# Patient Record
Sex: Male | Born: 1955 | Race: White | Hispanic: No | Marital: Married | Smoking: Light tobacco smoker
Health system: Southern US, Community
[De-identification: ages and names within clinical notes are randomized; demographics above are authoritative.]

## PROBLEM LIST (undated history)

## (undated) DIAGNOSIS — M1990 Unspecified osteoarthritis, unspecified site: Secondary | ICD-10-CM

## (undated) HISTORY — PX: OTHER SURGICAL HISTORY: SHX169

## (undated) HISTORY — PX: TOTAL HIP ARTHROPLASTY: SHX124

## (undated) HISTORY — PX: KNEE ARTHROSCOPY: SUR90

---

## 2002-05-01 ENCOUNTER — Encounter: Payer: Self-pay | Admitting: Family Medicine

## 2002-05-01 ENCOUNTER — Encounter: Admission: RE | Admit: 2002-05-01 | Discharge: 2002-05-01 | Payer: Self-pay | Admitting: Family Medicine

## 2006-04-07 ENCOUNTER — Ambulatory Visit: Payer: Self-pay | Admitting: Gastroenterology

## 2011-10-07 ENCOUNTER — Other Ambulatory Visit (HOSPITAL_COMMUNITY): Payer: Self-pay | Admitting: Neurological Surgery

## 2011-10-07 DIAGNOSIS — M545 Low back pain, unspecified: Secondary | ICD-10-CM

## 2011-10-07 DIAGNOSIS — M47816 Spondylosis without myelopathy or radiculopathy, lumbar region: Secondary | ICD-10-CM

## 2011-10-12 ENCOUNTER — Other Ambulatory Visit (HOSPITAL_COMMUNITY): Payer: Self-pay

## 2011-10-13 ENCOUNTER — Ambulatory Visit (HOSPITAL_COMMUNITY)
Admission: RE | Admit: 2011-10-13 | Discharge: 2011-10-13 | Disposition: A | Discharge status: Home / Self Care | Payer: Commercial Managed Care - PPO | Source: Ambulatory Visit | Attending: Neurological Surgery | Admitting: Neurological Surgery

## 2011-10-13 DIAGNOSIS — M25559 Pain in unspecified hip: Secondary | ICD-10-CM | POA: Insufficient documentation

## 2011-10-13 DIAGNOSIS — M545 Low back pain, unspecified: Secondary | ICD-10-CM

## 2011-10-13 DIAGNOSIS — M47816 Spondylosis without myelopathy or radiculopathy, lumbar region: Secondary | ICD-10-CM

## 2011-10-13 DIAGNOSIS — M48061 Spinal stenosis, lumbar region without neurogenic claudication: Secondary | ICD-10-CM | POA: Insufficient documentation

## 2011-10-13 DIAGNOSIS — M5126 Other intervertebral disc displacement, lumbar region: Secondary | ICD-10-CM | POA: Insufficient documentation

## 2011-12-20 ENCOUNTER — Other Ambulatory Visit: Payer: Self-pay | Admitting: Neurological Surgery

## 2012-01-16 ENCOUNTER — Encounter (HOSPITAL_COMMUNITY): Payer: Self-pay | Admitting: Pharmacy Technician

## 2012-01-17 ENCOUNTER — Inpatient Hospital Stay (HOSPITAL_COMMUNITY)
Admission: RE | Admit: 2012-01-17 | Discharge: 2012-01-17 | Payer: Commercial Managed Care - PPO | Source: Ambulatory Visit

## 2012-01-17 ENCOUNTER — Encounter (HOSPITAL_COMMUNITY): Payer: Self-pay

## 2012-01-17 NOTE — Pre-Procedure Instructions (Addendum)
18 Victor Snyder  01/17/2012   Your procedure is scheduled on:  01/27/12  Report to [facility] at 1015 AM.  Call this number if you have problems the morning of surgery: [phone]   Remember:   Do not eat food:After Midnight.  .  Take these medicines the morning of surgery with A SIP OF WATER: none stop meloxicam   Do not wear jewelry, make-up or nail polish.  Do not wear lotions, powders, or perfumes. You may wear deodorant.  Do not shave 48 hours prior to surgery. Men may shave face and neck.  Do not bring valuables to the hospital.  Contacts, dentures or bridgework may not be worn into surgery.  Leave suitcase in the car. After surgery it may be brought to your room.  For patients admitted to the hospital, checkout time is 11:00 AM the day of discharge.   Patients discharged the day of surgery will not be allowed to drive home.  Name and phone number of your driver:suzan [phone]  wife   Special Instructions: CHG Shower Use Special Wash: 1/2 bottle night before surgery and 1/2 bottle morning of surgery.   Please read over the following fact sheets that you were given: Pain Booklet, Coughing and Deep Breathing, MRSA Information and Surgical Site Infection Prevention

## 2012-01-18 ENCOUNTER — Encounter (HOSPITAL_COMMUNITY)
Admission: RE | Admit: 2012-01-18 | Discharge: 2012-01-18 | Disposition: A | Discharge status: Home / Self Care | Payer: Commercial Managed Care - PPO | Source: Ambulatory Visit | Attending: Neurological Surgery | Admitting: Neurological Surgery

## 2012-01-18 LAB — CBC
HCT: 39.1 % (ref 39.0–52.0)
Hemoglobin: 14.2 g/dL (ref 13.0–17.0)
MCH: 30.8 pg (ref 26.0–34.0)
MCHC: 36.3 g/dL — ABNORMAL HIGH (ref 30.0–36.0)
MCV: 84.8 fL (ref 78.0–100.0)
RDW: 13.7 % (ref 11.5–15.5)

## 2012-01-26 MED ORDER — DEXTROSE 5 % IV SOLN
2.0000 g | INTRAVENOUS | Status: AC
  Administered 2012-01-27: 2 g via INTRAVENOUS
  Filled 2012-01-26: qty 20

## 2012-01-27 ENCOUNTER — Ambulatory Visit (HOSPITAL_COMMUNITY): Payer: Commercial Managed Care - PPO

## 2012-01-27 ENCOUNTER — Encounter (HOSPITAL_COMMUNITY): Payer: Self-pay | Admitting: Critical Care Medicine

## 2012-01-27 ENCOUNTER — Encounter (HOSPITAL_COMMUNITY)
Admission: RE | Disposition: A | Discharge status: Home / Self Care | Payer: Self-pay | Source: Ambulatory Visit | Attending: Neurological Surgery

## 2012-01-27 ENCOUNTER — Ambulatory Visit (HOSPITAL_COMMUNITY): Payer: Commercial Managed Care - PPO | Admitting: Critical Care Medicine

## 2012-01-27 ENCOUNTER — Encounter (HOSPITAL_COMMUNITY): Payer: Self-pay | Admitting: *Deleted

## 2012-01-27 ENCOUNTER — Ambulatory Visit (HOSPITAL_COMMUNITY)
Admission: RE | Admit: 2012-01-27 | Discharge: 2012-01-27 | Disposition: A | Discharge status: Home / Self Care | Payer: Commercial Managed Care - PPO | Source: Ambulatory Visit | Attending: Neurological Surgery | Admitting: Neurological Surgery

## 2012-01-27 DIAGNOSIS — M5126 Other intervertebral disc displacement, lumbar region: Secondary | ICD-10-CM | POA: Insufficient documentation

## 2012-01-27 DIAGNOSIS — F17200 Nicotine dependence, unspecified, uncomplicated: Secondary | ICD-10-CM | POA: Insufficient documentation

## 2012-01-27 DIAGNOSIS — Z01812 Encounter for preprocedural laboratory examination: Secondary | ICD-10-CM | POA: Insufficient documentation

## 2012-01-27 DIAGNOSIS — M5127 Other intervertebral disc displacement, lumbosacral region: Secondary | ICD-10-CM

## 2012-01-27 HISTORY — PX: LUMBAR LAMINECTOMY/DECOMPRESSION MICRODISCECTOMY: SHX5026

## 2012-01-27 SURGERY — LUMBAR LAMINECTOMY/DECOMPRESSION MICRODISCECTOMY
Anesthesia: General | Site: Back | Laterality: Left | Wound class: Clean

## 2012-01-27 MED ORDER — LACTATED RINGERS IV SOLN
INTRAVENOUS | Status: DC | PRN
  Administered 2012-01-27 (×2): via INTRAVENOUS

## 2012-01-27 MED ORDER — HYDROMORPHONE HCL PF 1 MG/ML IJ SOLN: 0.2500 mg | INTRAMUSCULAR | Status: DC | PRN

## 2012-01-27 MED ORDER — MORPHINE SULFATE 2 MG/ML IJ SOLN: 1.0000 mg | INTRAMUSCULAR | Status: DC | PRN

## 2012-01-27 MED ORDER — NEOSTIGMINE METHYLSULFATE 1 MG/ML IJ SOLN
INTRAMUSCULAR | Status: DC | PRN
  Administered 2012-01-27: 3 mg via INTRAVENOUS

## 2012-01-27 MED ORDER — DEXAMETHASONE SODIUM PHOSPHATE 4 MG/ML IJ SOLN
INTRAMUSCULAR | Status: DC | PRN
  Administered 2012-01-27: 4 mg via INTRAVENOUS

## 2012-01-27 MED ORDER — OXYCODONE-ACETAMINOPHEN 5-325 MG PO TABS
1.0000 | ORAL_TABLET | ORAL | Status: DC | PRN
  Administered 2012-01-27: 2 via ORAL
  Filled 2012-01-27: qty 2

## 2012-01-27 MED ORDER — OXYCODONE-ACETAMINOPHEN 5-325 MG PO TABS: 1.0000 | ORAL_TABLET | ORAL | Status: AC | PRN

## 2012-01-27 MED ORDER — ONDANSETRON HCL 4 MG/2ML IJ SOLN
INTRAMUSCULAR | Status: DC | PRN
  Administered 2012-01-27: 4 mg via INTRAVENOUS

## 2012-01-27 MED ORDER — KETOROLAC TROMETHAMINE 15 MG/ML IJ SOLN: 15.0000 mg | Freq: Four times a day (QID) | INTRAMUSCULAR | Status: DC | PRN

## 2012-01-27 MED ORDER — HEMOSTATIC AGENTS (NO CHARGE) OPTIME
TOPICAL | Status: DC | PRN
  Administered 2012-01-27: 1 via TOPICAL

## 2012-01-27 MED ORDER — PHENOL 1.4 % MT LIQD: 1.0000 | OROMUCOSAL | Status: DC | PRN

## 2012-01-27 MED ORDER — KETOROLAC TROMETHAMINE 30 MG/ML IJ SOLN
INTRAMUSCULAR | Status: DC | PRN
  Administered 2012-01-27: 30 mg via INTRAVENOUS

## 2012-01-27 MED ORDER — FENTANYL CITRATE 0.05 MG/ML IJ SOLN
INTRAMUSCULAR | Status: DC | PRN
  Administered 2012-01-27 (×2): 50 ug via INTRAVENOUS
  Administered 2012-01-27: 150 ug via INTRAVENOUS

## 2012-01-27 MED ORDER — 0.9 % SODIUM CHLORIDE (POUR BTL) OPTIME
TOPICAL | Status: DC | PRN
  Administered 2012-01-27: 1000 mL

## 2012-01-27 MED ORDER — MELOXICAM 15 MG PO TABS
15.0000 mg | ORAL_TABLET | Freq: Every day | ORAL | Status: DC
Start: 2012-01-27 — End: 2012-01-27
  Administered 2012-01-27: 15 mg via ORAL
  Filled 2012-01-27: qty 1

## 2012-01-27 MED ORDER — BACITRACIN 50000 UNITS IM SOLR
INTRAMUSCULAR | Status: AC
  Filled 2012-01-27: qty 1

## 2012-01-27 MED ORDER — SODIUM CHLORIDE 0.9 % IV SOLN
250.0000 mL | INTRAVENOUS | Status: DC
Start: 2012-01-27 — End: 2012-01-27

## 2012-01-27 MED ORDER — DROPERIDOL 2.5 MG/ML IJ SOLN: 0.6250 mg | INTRAMUSCULAR | Status: DC | PRN

## 2012-01-27 MED ORDER — ROCURONIUM BROMIDE 100 MG/10ML IV SOLN
INTRAVENOUS | Status: DC | PRN
  Administered 2012-01-27: 50 mg via INTRAVENOUS

## 2012-01-27 MED ORDER — MIDAZOLAM HCL 5 MG/5ML IJ SOLN
INTRAMUSCULAR | Status: DC | PRN
  Administered 2012-01-27: 2 mg via INTRAVENOUS

## 2012-01-27 MED ORDER — DIAZEPAM 5 MG PO TABS: 5.0000 mg | ORAL_TABLET | Freq: Four times a day (QID) | ORAL | Status: AC | PRN

## 2012-01-27 MED ORDER — BUPIVACAINE HCL (PF) 0.5 % IJ SOLN
INTRAMUSCULAR | Status: DC | PRN
  Administered 2012-01-27: 10 mL
  Administered 2012-01-27: 5 mL

## 2012-01-27 MED ORDER — ALUM & MAG HYDROXIDE-SIMETH 200-200-20 MG/5ML PO SUSP: 30.0000 mL | Freq: Four times a day (QID) | ORAL | Status: DC | PRN

## 2012-01-27 MED ORDER — ACETAMINOPHEN 325 MG PO TABS: 650.0000 mg | ORAL_TABLET | ORAL | Status: DC | PRN

## 2012-01-27 MED ORDER — PROPOFOL 10 MG/ML IV EMUL
INTRAVENOUS | Status: DC | PRN
  Administered 2012-01-27: 50 mg via INTRAVENOUS
  Administered 2012-01-27: 150 mg via INTRAVENOUS

## 2012-01-27 MED ORDER — MENTHOL 3 MG MT LOZG: 1.0000 | LOZENGE | OROMUCOSAL | Status: DC | PRN

## 2012-01-27 MED ORDER — LIDOCAINE HCL (CARDIAC) 20 MG/ML IV SOLN
INTRAVENOUS | Status: DC | PRN
  Administered 2012-01-27: 100 mg via INTRAVENOUS

## 2012-01-27 MED ORDER — THROMBIN 5000 UNITS EX KIT
PACK | CUTANEOUS | Status: DC | PRN
  Administered 2012-01-27 (×2): 5000 [IU] via TOPICAL

## 2012-01-27 MED ORDER — ONDANSETRON HCL 4 MG/2ML IJ SOLN: 4.0000 mg | INTRAMUSCULAR | Status: DC | PRN

## 2012-01-27 MED ORDER — SODIUM CHLORIDE 0.9 % IV SOLN
INTRAVENOUS | Status: AC
  Filled 2012-01-27: qty 500

## 2012-01-27 MED ORDER — LIDOCAINE-EPINEPHRINE 1 %-1:100000 IJ SOLN
INTRAMUSCULAR | Status: DC | PRN
  Administered 2012-01-27: 5 mL

## 2012-01-27 MED ORDER — CEFAZOLIN SODIUM-DEXTROSE 2-3 GM-% IV SOLR
INTRAVENOUS | Status: AC
  Filled 2012-01-27: qty 50

## 2012-01-27 MED ORDER — SODIUM CHLORIDE 0.9 % IJ SOLN: 3.0000 mL | Freq: Two times a day (BID) | INTRAMUSCULAR | Status: DC

## 2012-01-27 MED ORDER — ACETAMINOPHEN 650 MG RE SUPP: 650.0000 mg | RECTAL | Status: DC | PRN

## 2012-01-27 MED ORDER — GLYCOPYRROLATE 0.2 MG/ML IJ SOLN
INTRAMUSCULAR | Status: DC | PRN
  Administered 2012-01-27: 0.4 mg via INTRAVENOUS

## 2012-01-27 MED ORDER — SODIUM CHLORIDE 0.9 % IJ SOLN: 3.0000 mL | INTRAMUSCULAR | Status: DC | PRN

## 2012-01-27 MED ORDER — SODIUM CHLORIDE 0.9 % IR SOLN
Status: DC | PRN
  Administered 2012-01-27: 14:00:00

## 2012-01-27 MED ORDER — DIAZEPAM 5 MG PO TABS: 5.0000 mg | ORAL_TABLET | Freq: Four times a day (QID) | ORAL | Status: DC | PRN

## 2012-01-27 SURGICAL SUPPLY — 52 items
BAG DECANTER FOR FLEXI CONT (MISCELLANEOUS) ×2 IMPLANT
BLADE SURG ROTATE 9660 (MISCELLANEOUS) ×2 IMPLANT
BUR ACORN 6.0 (BURR) IMPLANT
BUR MATCHSTICK NEURO 3.0 LAGG (BURR) ×2 IMPLANT
CANISTER SUCTION 2500CC (MISCELLANEOUS) ×2 IMPLANT
CLOTH BEACON ORANGE TIMEOUT ST (SAFETY) ×2 IMPLANT
CONT SPEC 4OZ CLIKSEAL STRL BL (MISCELLANEOUS) ×2 IMPLANT
DECANTER SPIKE VIAL GLASS SM (MISCELLANEOUS) ×2 IMPLANT
DERMABOND ADVANCED (GAUZE/BANDAGES/DRESSINGS) ×1
DERMABOND ADVANCED .7 DNX12 (GAUZE/BANDAGES/DRESSINGS) ×1 IMPLANT
DRAPE LAPAROTOMY 100X72X124 (DRAPES) ×2 IMPLANT
DRAPE MICROSCOPE LEICA (MISCELLANEOUS) ×2 IMPLANT
DRAPE POUCH INSTRU U-SHP 10X18 (DRAPES) ×2 IMPLANT
DRAPE PROXIMA HALF (DRAPES) IMPLANT
DURAPREP 26ML APPLICATOR (WOUND CARE) ×2 IMPLANT
ELECT REM PT RETURN 9FT ADLT (ELECTROSURGICAL) ×2
ELECTRODE REM PT RTRN 9FT ADLT (ELECTROSURGICAL) ×1 IMPLANT
GAUZE SPONGE 4X4 16PLY XRAY LF (GAUZE/BANDAGES/DRESSINGS) IMPLANT
GLOVE BIOGEL PI IND STRL 7.0 (GLOVE) ×1 IMPLANT
GLOVE BIOGEL PI IND STRL 8.5 (GLOVE) ×1 IMPLANT
GLOVE BIOGEL PI INDICATOR 7.0 (GLOVE) ×1
GLOVE BIOGEL PI INDICATOR 8.5 (GLOVE) ×1
GLOVE ECLIPSE 7.5 STRL STRAW (GLOVE) ×2 IMPLANT
GLOVE ECLIPSE 8.5 STRL (GLOVE) ×2 IMPLANT
GLOVE EXAM NITRILE LRG STRL (GLOVE) IMPLANT
GLOVE EXAM NITRILE MD LF STRL (GLOVE) IMPLANT
GLOVE EXAM NITRILE XL STR (GLOVE) IMPLANT
GLOVE EXAM NITRILE XS STR PU (GLOVE) IMPLANT
GLOVE SURG SS PI 7.0 STRL IVOR (GLOVE) ×4 IMPLANT
GOWN BRE IMP SLV AUR LG STRL (GOWN DISPOSABLE) IMPLANT
GOWN BRE IMP SLV AUR XL STRL (GOWN DISPOSABLE) ×6 IMPLANT
GOWN STRL REIN 2XL LVL4 (GOWN DISPOSABLE) ×2 IMPLANT
KIT BASIN OR (CUSTOM PROCEDURE TRAY) ×2 IMPLANT
KIT ROOM TURNOVER OR (KITS) ×2 IMPLANT
NEEDLE HYPO 22GX1.5 SAFETY (NEEDLE) ×2 IMPLANT
NEEDLE SPNL 20GX3.5 QUINCKE YW (NEEDLE) IMPLANT
NS IRRIG 1000ML POUR BTL (IV SOLUTION) ×2 IMPLANT
PACK LAMINECTOMY NEURO (CUSTOM PROCEDURE TRAY) ×2 IMPLANT
PAD ARMBOARD 7.5X6 YLW CONV (MISCELLANEOUS) ×6 IMPLANT
PATTIES SURGICAL .5 X1 (DISPOSABLE) ×2 IMPLANT
RUBBERBAND STERILE (MISCELLANEOUS) ×4 IMPLANT
SPONGE GAUZE 4X4 12PLY (GAUZE/BANDAGES/DRESSINGS) ×2 IMPLANT
SPONGE SURGIFOAM ABS GEL SZ50 (HEMOSTASIS) ×2 IMPLANT
SUT VIC AB 1 CT1 18XBRD ANBCTR (SUTURE) ×1 IMPLANT
SUT VIC AB 1 CT1 8-18 (SUTURE) ×1
SUT VIC AB 2-0 CP2 18 (SUTURE) ×2 IMPLANT
SUT VIC AB 3-0 SH 8-18 (SUTURE) ×2 IMPLANT
SYR 20ML ECCENTRIC (SYRINGE) ×2 IMPLANT
TAPE CLOTH SURG 4X10 WHT LF (GAUZE/BANDAGES/DRESSINGS) ×2 IMPLANT
TOWEL OR 17X24 6PK STRL BLUE (TOWEL DISPOSABLE) ×2 IMPLANT
TOWEL OR 17X26 10 PK STRL BLUE (TOWEL DISPOSABLE) ×2 IMPLANT
WATER STERILE IRR 1000ML POUR (IV SOLUTION) ×2 IMPLANT

## 2012-01-27 NOTE — Transfer of Care (Signed)
Immediate Anesthesia Transfer of Care Note  Patient: Victor Snyder  Procedure(s) Performed: Procedure(s) (LRB): LUMBAR LAMINECTOMY/DECOMPRESSION MICRODISCECTOMY (Left)  Patient Location: PACU  Anesthesia Type: General  Level of Consciousness: awake, alert  and oriented  Airway & Oxygen Therapy: Patient Spontanous Breathing and Patient connected to nasal cannula oxygen  Post-op Assessment: Report given to PACU RN, Post -op Vital signs reviewed and stable and Patient moving all extremities X 4  Post vital signs: Reviewed and stable  Complications: No apparent anesthesia complications

## 2012-01-27 NOTE — Anesthesia Postprocedure Evaluation (Signed)
Anesthesia Post Note  Patient: Victor Snyder  Procedure(s) Performed: Procedure(s) (LRB): LUMBAR LAMINECTOMY/DECOMPRESSION MICRODISCECTOMY (Left)  Anesthesia type: general  Patient location: PACU  Post pain: Pain level controlled  Post assessment: Patient's Cardiovascular Status Stable  Last Vitals:  Filed Vitals:   01/27/12 1600  BP: 135/81  Pulse: 37  Temp:   Resp: 20    Post vital signs: Reviewed and stable  Level of consciousness: sedated  Complications: No apparent anesthesia complications

## 2012-01-27 NOTE — Progress Notes (Signed)
Patient ID: EGE MUCKEY, male   DOB: 08/26/1955, 56 y.o.   MRN: [id number] Incision is clean and dry motor function is intact. The patient is ambulating this evening will be discharged after 7 PM.

## 2012-01-27 NOTE — Discharge Summary (Signed)
  Admitting diagnosis: Herniated nucleus pulposus L5-S1 left with left lumbar radiculopathy Discharge diagnosis: Herniated nucleus pulposus L5-S1 left with left lumbar radiculopathy Final diagnosis: Herniated nucleus pulposus L5-S1 left with left lumbar radiculopathy Condition: Stable Surgery: Microdiscectomy L5-S1 left with operating microscope microdissection technique Discharge medications: Percocet 5 mg #40 without refills, Valium 5 mg #30 without refills Followup outpatient 3 weeks

## 2012-01-27 NOTE — Preoperative (Signed)
Beta Blockers   Reason not to administer Beta Blockers:Not Applicable 

## 2012-01-27 NOTE — Progress Notes (Signed)
Pt. Alert and oriented,follows simple instructions, denies pain. Incision area without swelling, redness or S/S of infection. Voiding adequate clear yellow urine. Moving all extremities well and vitals stable and documented. Lumbar surgery notes instructions given to patient and family member for home safety and precautions.Pt and family stated understanding of instructions given. Pain med given per patients request for pain and discomfort of ride home.

## 2012-01-27 NOTE — Plan of Care (Signed)
Problem: Consults Goal: Diagnosis - Spinal Surgery Outcome: Completed/Met Date Met:  01/27/12 Microdiscectomy

## 2012-01-27 NOTE — H&P (Signed)
Victor Snyder is an 56 y.o. male.   Chief Complaint: Back and left leg pain HPI:      Mr. Victor Snyder presents for surgery today.  He had an evaluation by Dr. Thurston Hole regarding his hips and he was advised to [place] some nonsteroidal anti-inflammatories and will be rechecked in several months time.    He still has significant back and left lower extremity pain.  His MRI of the lumbar spine was reviewed with him.  He has evidence of an extruded fragment of disc at L5-S1 particularly eccentric to the left side where it is compressing and elevating the L5 nerve root and the S1 nerve root in its entry into the foramen.  I have advised that ultimately he may need to consider a microdiskectomy at L5-S1 on the left side.  I discussed the surgical intervention with him and demonstrated the findings on the films.  He is considering this surgery carefully.  He is using the nonsteroidal anti-inflammatory and it does seem to be helping some with his symptoms.  Nonetheless, if his symptoms continue, he may wish to consider the surgical intervention for this process.    I discussed with him the major risks of surgery including the possibility of spinal fluid leak, and potential for nerve damage and the fact that microdiskectomy does not render the patient a new disc.  He understands all of these things and will be in contact with [place] when and if he desires to proceed.          [name], M.D./gde   CHIEF COMPLAINT:   Low back pain, tight hamstrings, loss of range of motion.  "Feels like a catch in the left groin".  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  He tells me that since approximately 2010 he has had problems with pain in the low back and hamstrings with a significant sensation of pain and catch in his left groin.  He has had some cortisone shots in the past and back then this made him feel somewhat better.  Since that time, he has  had considerable difficulties with discomfort in the low back and tightness in both hamstrings.  He tries to work this out with physical activity. He notes his problems seem to be getting somewhat worse.  He has not had any recent films.  Today in the office I obtained an AP and lateral radiograph of the lumbar spine in addition to an AP pelvis.  The films on the AP pelvis reveals that the patient has some advancing hip arthritis particularly in the left hip compared to the right but he also has some changes in the right hip.  In the left hip, he has bone on bone in the superior aspect of the hip joint.  I demonstrated these findings to him.  In the lumbar spine, I noted that the patient appears to have either a fully formed 6 lumbar vertebrae or an independent first sacral vertebra or at least a partially sacralized vertebra.  He has advanced degenerative changes at the L5-S1 joint.  His alignment in the coronal and sagittal planes is quite good.  However, the degree of desiccation and collapse at L5-S1 is substantial.  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  PAST MEDICAL HISTORY:  The patient's general health has been good.  He reports  no significant medical problems.  He tells me that he has had some arthroscopic right knee surgery.  He has had a cortisone shot in his back some 5-6 years ago.  SOCIAL HISTORY:    He does not smoke.  He drinks alcohol on a social basis.  Height and weight have been stable at 250 pounds and 6'2" tall.    FAMILY HISTORY:    Both of his parents are in good health at age 31 and 24.  There is some hypertension in the family.  REVIEW OF SYSTEMS:   Notable for wearing of glasses, hearing loss, ringing in the ears, back pain on a 14-point review sheet.    PHYSICAL EXAMINATION:  The patient is an alert and oriented individual. He stands straight and erect without  difficulty.  Motor strength in the lower extremities shows good strength in the iliopsoas, quads, tibialis anterior and gastrocs.  DTR's are 2+ in the patellae, 1+ in the Achilles and Babinski's are downgoing.  Straight leg raising is negative to 80 degrees.  Patrick's maneuver is modestly positive in both the lower extremities.    IMPRESSION:    The patient has evidence of some positive Patrick's maneuver and hip tenderness, and tenderness over the greater trochanter.  His motor function is neurologically intact.  In light of the fact that he has advanced spondylitic disease in the lower lumbar spine and possibly and extra lumbar vertebra, I suggested that we should do an MRI of the lumbar spine.  I would like to see him back after this has been completed.  I the meantime, I also suggested that we have him seen by an orthopedist as I believe a good deal of his groin pain and hip pain on the left side particularly is related to the hip arthritis.  I have suggested that we have him seen Dr. Hadassah Pais downstairs and will make this arrangement today.    No past medical history on file.  Past Surgical History  Procedure Date  . Knee arthroscopy     rt    No family history on file. Social History:  reports that he has been smoking Cigars.  He does not have any smokeless tobacco history on file. He reports that he does not use illicit drugs. His alcohol history not on file.  Allergies: No Known Allergies  No prescriptions prior to admission    No results found for this or any previous visit (from the past 48 hour(s)). No results found.  Review of Systems  Constitutional: Negative.   HENT: Negative.   Eyes: Negative.   Respiratory: Negative.   Cardiovascular: Negative.   Gastrointestinal: Negative.   Genitourinary: Negative.   Musculoskeletal: Positive for back pain.  Skin: Negative.   Neurological: Positive for sensory change and focal weakness.  Endo/Heme/Allergies: Negative.     Psychiatric/Behavioral: Negative.     There were no vitals taken for this visit. Physical Exam  Vitals reviewed. Constitutional: He appears well-developed and well-nourished.  HENT:  Head: Normocephalic and atraumatic.  Eyes: Conjunctivae and EOM are normal. Pupils are equal, round, and reactive to light.  Neck: Normal range of motion. Neck supple.  Cardiovascular: Normal rate, regular rhythm and normal heart sounds.   Respiratory: Effort normal and breath sounds normal.  GI: Soft. Bowel sounds are normal.  Musculoskeletal: Normal range of motion.  Neurological: He displays abnormal reflex.  Skin: Skin is warm and dry.  Psychiatric: He has a normal mood and affect. His behavior is normal. Judgment  and thought content normal.     Assessment/Plan HNP l5 S1 for microdiscectomy.  Shadana Pry J 01/27/2012, 4:15 AM

## 2012-01-27 NOTE — Anesthesia Procedure Notes (Signed)
Procedure Name: Intubation Date/Time: 01/27/2012 1:32 PM Performed by: Elon Alas Pre-anesthesia Checklist: Patient identified, Timeout performed, Emergency Drugs available, Suction available and Patient being monitored Patient Re-evaluated:Patient Re-evaluated prior to inductionOxygen Delivery Method: Circle system utilized Preoxygenation: Pre-oxygenation with 100% oxygen Intubation Type: IV induction Ventilation: Mask ventilation without difficulty Laryngoscope Size: Mac and 4 Grade View: Grade II Tube type: Oral Tube size: 8.0 mm Number of attempts: 1 Airway Equipment and Method: Stylet Placement Confirmation: positive ETCO2,  breath sounds checked- equal and bilateral and ETT inserted through vocal cords under direct vision Secured at: 23 cm Tube secured with: Tape Dental Injury: Teeth and Oropharynx as per pre-operative assessment

## 2012-01-27 NOTE — Anesthesia Preprocedure Evaluation (Addendum)
Anesthesia Evaluation  Patient identified by MRN, date of birth, ID band Patient awake    Reviewed: Allergy & Precautions, H&P , NPO status , Patient's Chart, lab work & pertinent test results  Airway Mallampati: II TM Distance: >3 FB Neck ROM: full    Dental  (+) Teeth Intact and Dental Advidsory Given   Pulmonary neg pulmonary ROS, Current Smoker,  breath sounds clear to auscultation  Pulmonary exam normal       Cardiovascular negative cardio ROS  Rhythm:regular Rate:Normal     Neuro/Psych    GI/Hepatic negative GI ROS, Neg liver ROS,   Endo/Other  negative endocrine ROS  Renal/GU negative Renal ROS     Musculoskeletal   Abdominal Normal abdominal exam  (+)   Peds  Hematology negative hematology ROS (+)   Anesthesia Other Findings   Reproductive/Obstetrics                          Anesthesia Physical Anesthesia Plan  ASA: II  Anesthesia Plan: General ETT   Post-op Pain Management:    Induction:   Airway Management Planned:   Additional Equipment:   Intra-op Plan:   Post-operative Plan:   Informed Consent: I have reviewed the patients History and Physical, chart, labs and discussed the procedure including the risks, benefits and alternatives for the proposed anesthesia with the patient or authorized representative who has indicated his/her understanding and acceptance.   Dental Advisory Given  Plan Discussed with: Anesthesiologist, CRNA and Surgeon  Anesthesia Plan Comments:         Anesthesia Quick Evaluation

## 2012-01-27 NOTE — Discharge Instructions (Signed)
Herniated Disk The bones of your spinal column (vertebrae) protect your spinal cord and nerves that go into your arms and legs. The vertebrae are separated by disks that cushion the spinal column and put space between your vertebrae. This allows movement between the vertebrae, which allows you to bend, rotate, and move your body from side to side. Sometimes, the disks move out of place (herniate) or break open (rupture) from injury or strain. The most common area for a disk herniation is in the lower back (lumbar area). Sometimes herniation occurs in the neck (cervical) disks.  CAUSES  As we grow older, the strong, fibrous cords that connect the vertebrae and support and surround the disks (ligaments) start to weaken. A strain on the back may cause a break in the disk ligaments. RISK FACTORS Herniated disks occur most often in men who are aged 18 years to 35 years, usually after strenuous activity. Other risk factors include conditions present at birth (congenital) that affect the size of the lumbar spinal canal. Additionally, a narrowing of the areas where the nerves exit the spinal canal can occur as you age. SYMPTOMS  Symptoms of a herniated disk vary. You may have weakness in certain muscles. This weakness can include difficulty lifting your leg or arm, difficulty standing on your toes on one side, or difficulty squeezing tightly with one of your hands. You may have numbness. You may feel a mild tingling, dull ache, or a burning or pulsating pain. In some cases, the pain is severe enough that you are unable to move. The pain most often occurs on one side of the body. The pain often starts slowly. It may get worse:  After you sit or stand.   At night.   When you sneeze, cough, or laugh.   When you bend backwards or walk more than a few yards.  The pain, numbness, or weakness will often go away or improve a lot over a period of weeks to months. Herniated lumbar disk Symptoms of a herniated  lumbar disk may include sharp pain in one part of your leg, hip, or buttocks and numbness in other parts. You also may feel pain or numbness on the back of your calf or the top or sole of your foot. The same leg also may feel weak. Herniated cervical disk Symptoms of a herniated cervical disk may include pain when you move your neck, deep pain near or over your shoulder blade, or pain that moves to your upper arm, forearm, or fingers. DIAGNOSIS  To diagnose a herniated disk, your caregiver will perform a physical exam. Your caregiver also may perform diagnostic tests to see your disk or to test the reaction of your muscles and the function of your nerves. During the physical exam, your caregiver may ask you to:  Sit, stand, and walk. While you walk, your caregiver may ask you to try walking on your toes and then your heels.   Bend forward, backward, and sideways.   Raise your shoulders, elbow, wrist, and fingers and check your strength during these tasks.  Your caregiver will check for:  Numbness or loss of feeling.   Muscle reflexes, which may be slower or missing.   Muscle strength, which may be weaker.   Posture or the way your spine curves.  Diagnostic tests that may be done include:  A spinal X-ray exam to rule out other causes of back pain.   Magnetic resonance imaging (MRI) or computed tomography (CT) scan, which will show   if the herniated disk is pressing on your spinal canal.   Electromyography. This is sometimes used to identify the specific area of nerve involvement.  TREATMENT  Initial treatment for a herniated disk is a short period of rest with medicines for pain. Pain medicines can include nonsteroidal anti-inflammatory medicines (NSAIDs), muscle relaxants for back spasms, and (rarely) narcotic pain medicine for severe pain that does not respond to NSAID use. Bed rest is often limited to 1 or 2 days at the most because prolonged rest can delay recovery. When the herniation  involves the lower back, sitting should be avoided as much as possible because sitting increases pressure on the ruptured disk. Sometimes a soft neck collar will be prescribed for a few days to weeks to help support your neck in the case of a cervical herniation. Physical therapy is often prescribed for patients with disk disease. Physical therapists will teach you how to properly lift, dress, walk, and perform other activities. They will work on strengthening the muscles that help support your spine. In some cases, physical therapy alone is not enough to treat a herniated disk. Steroid injections along the involved nerve root may be needed to help control pain. The steroid is injected in the area of the herniated disk and helps by reducing swelling around the disk. Sometimes surgery is the best option to treat a herniated disk.  SEEK IMMEDIATE MEDICAL CARE IF:   You have numbness, tingling, weakness, or problems with the use of your arms or legs.   You have severe headaches that are not relieved with the use of medicines.   You notice a change in your bowel or bladder control.   You have increasing pain in any areas of your body.   You experience shortness of breath, dizziness, or fainting.  MAKE SURE YOU:   Understand these instructions.   Will watch your condition.   Will get help right away if you are not doing well or get worse.  Document Released: 07/15/2000 Document Revised: 07/07/2011 Document Reviewed: 02/18/2011 [employer] Patient Information 2012 [employer]. 

## 2012-01-27 NOTE — Op Note (Signed)
Preoperative diagnosis: Hernia nucleus pulposus L5-S1 left left lumbar radiculopathy Postoperative diagnosis: Same Procedure: Microdiscectomy L5-S1 left, operating microscope microdissection technique Surgeon: Barnett Abu M.D. Assistant: Sharyon Cable M.D. Indications: Patient is a 56 year old individual is had significant back and left lower extremity pain with some occasional numbness on the right side patient notes that the pain has been persisting and workup includes x-rays of the hip show some mild arthritic changes on the left side with the patient has been advised that most of his problem is coming from his lumbar spine he's had some treatment with epidural steroid injections initially given some modest relief after some discussion the patient is taken to the operating room to undergo microdiscectomy L5-S1 on the left side.  Procedure: The patient was brought to the operating room supine on a stretcher. After the smooth induction of general endotracheal anesthesia patient was carefully turned to the prone position with the bony prominences being appropriately padded and protected. The lower lumbar spine was prepped with alcohol and DuraPrep and then draped in a sterile fashion. The needle was used to localize the area of L5-S1 and a radiograph was obtained demonstrating a needle at the level of L5-S1. Skin was infiltrated with 10 cc of lidocaine 1-100,000 epinephrine mixed 50-50 with half percent Marcaine. An incision was made and carried down to the lumbar dorsal fascia the fascia was opened on the left side of the midline and a subperiosteal dissection was performed in the interlaminar space of L5-S1. A self-retaining retractor was then placed into the wound. The yellow ligament was taken up from the interlaminar space at L5-S1 and the inferior margin of the lamina of L5 was removed and a partial facetectomy was performed at L5-S1. The common dural tube was explored and the take off of the S1 nerve  root was identified and gradually mobilized. A soft tissue mass was encountered just inferior to the disc space. This was incised. Some fragments of disc that were here into the endplate into the ligamentous tissue were removed. The area was explored further and a discectomy was performed at the L5-S1 disc space area and a combination of curettes and rongeurs were used to evacuate a significant quantity of severely degenerated and desiccated disc material.  The disc space was evacuated of a significant quantity of moderately degenerated disc material. Dissection was carried out under the use of the operating microscope with the assistant providing traction and exposure and decompressing the lateral portion of the sac while I worked medially.  After adequate decompression was obtained hemostasis and the soft tissues was verified retractor was removed the operating microscope was removed from the field and the lumbar dorsal fascia was closed with #1 and 2-0 Vicryl in interrupted fashion. 3-0 Vicryl was used to close subcuticular skin. Blood loss was estimated at 50 cc.

## 2012-01-30 ENCOUNTER — Encounter (HOSPITAL_COMMUNITY): Payer: Self-pay | Admitting: Neurological Surgery

## 2012-12-25 ENCOUNTER — Other Ambulatory Visit (HOSPITAL_COMMUNITY): Payer: Self-pay | Admitting: Orthopaedic Surgery

## 2013-01-28 ENCOUNTER — Encounter (HOSPITAL_COMMUNITY): Payer: Self-pay | Admitting: Pharmacy Technician

## 2013-02-04 ENCOUNTER — Other Ambulatory Visit (HOSPITAL_COMMUNITY): Payer: Commercial Managed Care - PPO

## 2013-02-04 ENCOUNTER — Encounter (HOSPITAL_COMMUNITY): Payer: Self-pay

## 2013-02-04 ENCOUNTER — Encounter (HOSPITAL_COMMUNITY)
Admission: RE | Admit: 2013-02-04 | Discharge: 2013-02-04 | Disposition: A | Discharge status: Home / Self Care | Payer: Commercial Managed Care - PPO | Source: Ambulatory Visit | Attending: Orthopaedic Surgery | Admitting: Orthopaedic Surgery

## 2013-02-04 DIAGNOSIS — Z01812 Encounter for preprocedural laboratory examination: Secondary | ICD-10-CM | POA: Insufficient documentation

## 2013-02-04 LAB — URINALYSIS, ROUTINE W REFLEX MICROSCOPIC
Bilirubin Urine: NEGATIVE
Glucose, UA: NEGATIVE mg/dL
Hgb urine dipstick: NEGATIVE
Specific Gravity, Urine: 1.026 (ref 1.005–1.030)
Urobilinogen, UA: 0.2 mg/dL (ref 0.0–1.0)

## 2013-02-04 LAB — COMPREHENSIVE METABOLIC PANEL
ALT: 20 U/L (ref 0–53)
Alkaline Phosphatase: 56 U/L (ref 39–117)
BUN: 16 mg/dL (ref 6–23)
CO2: 28 mEq/L (ref 19–32)
GFR calc Af Amer: >90 mL/min (ref >90)
GFR calc non Af Amer: >90 mL/min (ref >90)
Glucose, Bld: 104 mg/dL — ABNORMAL HIGH (ref 70–99)
Potassium: 5.1 mEq/L (ref 3.5–5.1)
Sodium: 141 mEq/L (ref 135–145)
Total Bilirubin: 0.7 mg/dL (ref 0.3–1.2)
Total Protein: 7.7 g/dL (ref 6.0–8.3)

## 2013-02-04 LAB — CBC
HCT: 42.8 % (ref 39.0–52.0)
Hemoglobin: 15.7 g/dL (ref 13.0–17.0)
MCHC: 36.7 g/dL — ABNORMAL HIGH (ref 30.0–36.0)
MCV: 85.4 fL (ref 78.0–100.0)
RDW: 13.7 % (ref 11.5–15.5)

## 2013-02-04 LAB — PROTIME-INR: Prothrombin Time: 12.9 seconds (ref 11.6–15.2)

## 2013-02-04 NOTE — Progress Notes (Signed)
Patient refused to wear BB armband will obtain type and screen DOS

## 2013-02-04 NOTE — Pre-Procedure Instructions (Signed)
Victor Snyder  02/04/2013   Your procedure is scheduled on:  July 15  Report to [facility] at 10:45 AM.  Call this number if you have problems the morning of surgery: [phone]   Remember:   Do not eat food or drink liquids after midnight.   Take these medicines the morning of surgery with A SIP OF WATER: None   Stop Mobic today  Do not wear jewelry, make-up or nail polish.  Do not wear lotions, powders, or perfumes. You may wear deodorant.  Do not shave 48 hours prior to surgery. Men may shave face and neck.  Do not bring valuables to the hospital.  [facility] is not responsible  for any belongings or valuables.  Contacts, dentures or bridgework may not be worn into surgery.  Leave suitcase in the car. After surgery it may be brought to your room.  For patients admitted to the hospital, checkout time is 11:00 AM the day of discharge.   Special Instructions: Shower using CHG 2 nights before surgery and the night before surgery.  If you shower the day of surgery use CHG.  Use special wash - you have one bottle of CHG for all showers.  You should use approximately 1/3 of the bottle for each shower.   Please read over the following fact sheets that you were given: Pain Booklet, Coughing and Deep Breathing, Blood Transfusion Information, Total Joint Packet, MRSA Information and Surgical Site Infection Prevention

## 2013-02-11 MED ORDER — CEFAZOLIN SODIUM-DEXTROSE 2-3 GM-% IV SOLR
2.0000 g | INTRAVENOUS | Status: AC
  Administered 2013-02-12: 2 g via INTRAVENOUS
  Filled 2013-02-11: qty 50

## 2013-02-11 NOTE — Progress Notes (Signed)
Left a message for pt that new arrival time is 1030.Pt was asked to call and confirm that he received this info

## 2013-02-12 ENCOUNTER — Inpatient Hospital Stay (HOSPITAL_COMMUNITY): Payer: Commercial Managed Care - PPO | Admitting: Anesthesiology

## 2013-02-12 ENCOUNTER — Encounter (HOSPITAL_COMMUNITY)
Admission: RE | Disposition: A | Discharge status: Home / Self Care | Payer: Self-pay | Source: Ambulatory Visit | Attending: Orthopaedic Surgery

## 2013-02-12 ENCOUNTER — Inpatient Hospital Stay (HOSPITAL_COMMUNITY): Payer: Commercial Managed Care - PPO

## 2013-02-12 ENCOUNTER — Encounter (HOSPITAL_COMMUNITY): Payer: Self-pay | Admitting: Anesthesiology

## 2013-02-12 ENCOUNTER — Inpatient Hospital Stay (HOSPITAL_COMMUNITY)
Admission: RE | Admit: 2013-02-12 | Discharge: 2013-02-14 | DRG: 470 | Disposition: A | Discharge status: Home / Self Care | Payer: Commercial Managed Care - PPO | Source: Ambulatory Visit | Attending: Orthopaedic Surgery | Admitting: Orthopaedic Surgery

## 2013-02-12 ENCOUNTER — Encounter (HOSPITAL_COMMUNITY): Payer: Self-pay | Admitting: *Deleted

## 2013-02-12 DIAGNOSIS — F17200 Nicotine dependence, unspecified, uncomplicated: Secondary | ICD-10-CM | POA: Diagnosis present

## 2013-02-12 DIAGNOSIS — M169 Osteoarthritis of hip, unspecified: Secondary | ICD-10-CM

## 2013-02-12 DIAGNOSIS — M1610 Unilateral primary osteoarthritis, unspecified hip: Principal | ICD-10-CM | POA: Diagnosis present

## 2013-02-12 HISTORY — PX: TOTAL HIP ARTHROPLASTY: SHX124

## 2013-02-12 HISTORY — DX: Unspecified osteoarthritis, unspecified site: M19.90

## 2013-02-12 SURGERY — ARTHROPLASTY, HIP, TOTAL, ANTERIOR APPROACH
Anesthesia: Choice | Site: Hip | Laterality: Left | Wound class: Clean

## 2013-02-12 MED ORDER — OXYCODONE HCL 5 MG PO TABS
5.0000 mg | ORAL_TABLET | ORAL | Status: DC | PRN
  Administered 2013-02-12: 10 mg via ORAL

## 2013-02-12 MED ORDER — MUPIROCIN 2 % EX OINT
TOPICAL_OINTMENT | Freq: Two times a day (BID) | CUTANEOUS | Status: DC
  Administered 2013-02-12 – 2013-02-13 (×2): via NASAL
  Filled 2013-02-12: qty 22

## 2013-02-12 MED ORDER — ONDANSETRON HCL 4 MG/2ML IJ SOLN: 4.0000 mg | Freq: Four times a day (QID) | INTRAMUSCULAR | Status: DC | PRN

## 2013-02-12 MED ORDER — ASPIRIN EC 325 MG PO TBEC
325.0000 mg | DELAYED_RELEASE_TABLET | Freq: Two times a day (BID) | ORAL | Status: DC
  Administered 2013-02-12 – 2013-02-14 (×4): 325 mg via ORAL
  Filled 2013-02-12 (×6): qty 1

## 2013-02-12 MED ORDER — SODIUM CHLORIDE 0.9 % IR SOLN
Status: DC | PRN
  Administered 2013-02-12: 3000 mL

## 2013-02-12 MED ORDER — OXYCODONE HCL 5 MG/5ML PO SOLN: 5.0000 mg | Freq: Once | ORAL | Status: DC | PRN

## 2013-02-12 MED ORDER — CEFAZOLIN SODIUM 1-5 GM-% IV SOLN
1.0000 g | Freq: Four times a day (QID) | INTRAVENOUS | Status: AC
  Administered 2013-02-12 – 2013-02-13 (×2): 1 g via INTRAVENOUS
  Filled 2013-02-12 (×2): qty 50

## 2013-02-12 MED ORDER — HYDROMORPHONE HCL PF 1 MG/ML IJ SOLN
INTRAMUSCULAR | Status: AC
  Filled 2013-02-12: qty 1

## 2013-02-12 MED ORDER — POLYETHYLENE GLYCOL 3350 17 G PO PACK: 17.0000 g | PACK | Freq: Every day | ORAL | Status: DC | PRN

## 2013-02-12 MED ORDER — OXYCODONE HCL 5 MG PO TABS: 5.0000 mg | ORAL_TABLET | Freq: Once | ORAL | Status: DC | PRN

## 2013-02-12 MED ORDER — METHOCARBAMOL 100 MG/ML IJ SOLN
500.0000 mg | Freq: Four times a day (QID) | INTRAMUSCULAR | Status: DC | PRN
  Filled 2013-02-12: qty 5

## 2013-02-12 MED ORDER — HYDROMORPHONE HCL PF 1 MG/ML IJ SOLN
0.2500 mg | INTRAMUSCULAR | Status: DC | PRN
  Administered 2013-02-12 (×4): 0.5 mg via INTRAVENOUS

## 2013-02-12 MED ORDER — DOCUSATE SODIUM 100 MG PO CAPS
100.0000 mg | ORAL_CAPSULE | Freq: Two times a day (BID) | ORAL | Status: DC
  Administered 2013-02-12 – 2013-02-14 (×4): 100 mg via ORAL
  Filled 2013-02-12 (×5): qty 1

## 2013-02-12 MED ORDER — DEXTROSE 5 % IV SOLN
INTRAVENOUS | Status: DC | PRN
  Administered 2013-02-12: 13:00:00 via INTRAVENOUS

## 2013-02-12 MED ORDER — HYDROMORPHONE HCL PF 1 MG/ML IJ SOLN
1.0000 mg | INTRAMUSCULAR | Status: DC | PRN
  Administered 2013-02-12 – 2013-02-13 (×2): 1 mg via INTRAVENOUS
  Filled 2013-02-12 (×3): qty 1

## 2013-02-12 MED ORDER — ZOLPIDEM TARTRATE 5 MG PO TABS: 5.0000 mg | ORAL_TABLET | Freq: Every evening | ORAL | Status: DC | PRN

## 2013-02-12 MED ORDER — GLYCOPYRROLATE 0.2 MG/ML IJ SOLN
INTRAMUSCULAR | Status: DC | PRN
  Administered 2013-02-12: .8 mg via INTRAVENOUS
  Administered 2013-02-12: 0.2 mg via INTRAVENOUS

## 2013-02-12 MED ORDER — ACETAMINOPHEN 650 MG RE SUPP: 650.0000 mg | Freq: Four times a day (QID) | RECTAL | Status: DC | PRN

## 2013-02-12 MED ORDER — ALUM & MAG HYDROXIDE-SIMETH 200-200-20 MG/5ML PO SUSP: 30.0000 mL | ORAL | Status: DC | PRN

## 2013-02-12 MED ORDER — DIPHENHYDRAMINE HCL 12.5 MG/5ML PO ELIX: 12.5000 mg | ORAL_SOLUTION | ORAL | Status: DC | PRN

## 2013-02-12 MED ORDER — ONDANSETRON HCL 4 MG PO TABS: 4.0000 mg | ORAL_TABLET | Freq: Four times a day (QID) | ORAL | Status: DC | PRN

## 2013-02-12 MED ORDER — METOCLOPRAMIDE HCL 5 MG/ML IJ SOLN: 5.0000 mg | Freq: Three times a day (TID) | INTRAMUSCULAR | Status: DC | PRN

## 2013-02-12 MED ORDER — OXYCODONE HCL 5 MG PO TABS
ORAL_TABLET | ORAL | Status: AC
  Filled 2013-02-12: qty 2

## 2013-02-12 MED ORDER — 0.9 % SODIUM CHLORIDE (POUR BTL) OPTIME
TOPICAL | Status: DC | PRN
  Administered 2013-02-12: 1000 mL

## 2013-02-12 MED ORDER — NEOSTIGMINE METHYLSULFATE 1 MG/ML IJ SOLN
INTRAMUSCULAR | Status: DC | PRN
  Administered 2013-02-12: 1 mg via INTRAVENOUS
  Administered 2013-02-12: 4 mg via INTRAVENOUS

## 2013-02-12 MED ORDER — LACTATED RINGERS IV SOLN
INTRAVENOUS | Status: DC | PRN
  Administered 2013-02-12 (×2): via INTRAVENOUS

## 2013-02-12 MED ORDER — PHENOL 1.4 % MT LIQD: 1.0000 | OROMUCOSAL | Status: DC | PRN

## 2013-02-12 MED ORDER — FERROUS SULFATE 325 (65 FE) MG PO TABS
325.0000 mg | ORAL_TABLET | Freq: Three times a day (TID) | ORAL | Status: DC
  Administered 2013-02-12 – 2013-02-14 (×5): 325 mg via ORAL
  Filled 2013-02-12 (×8): qty 1

## 2013-02-12 MED ORDER — METHOCARBAMOL 500 MG PO TABS
500.0000 mg | ORAL_TABLET | Freq: Four times a day (QID) | ORAL | Status: DC | PRN
  Administered 2013-02-12 – 2013-02-13 (×4): 500 mg via ORAL
  Filled 2013-02-12 (×3): qty 1

## 2013-02-12 MED ORDER — BISACODYL 5 MG PO TBEC: 5.0000 mg | DELAYED_RELEASE_TABLET | Freq: Every day | ORAL | Status: DC | PRN

## 2013-02-12 MED ORDER — MENTHOL 3 MG MT LOZG: 1.0000 | LOZENGE | OROMUCOSAL | Status: DC | PRN

## 2013-02-12 MED ORDER — ROCURONIUM BROMIDE 100 MG/10ML IV SOLN
INTRAVENOUS | Status: DC | PRN
  Administered 2013-02-12: 10 mg via INTRAVENOUS
  Administered 2013-02-12: 40 mg via INTRAVENOUS

## 2013-02-12 MED ORDER — LACTATED RINGERS IV SOLN
INTRAVENOUS | Status: DC
  Administered 2013-02-12: 11:00:00 via INTRAVENOUS

## 2013-02-12 MED ORDER — METHOCARBAMOL 500 MG PO TABS
ORAL_TABLET | ORAL | Status: AC
  Filled 2013-02-12: qty 1

## 2013-02-12 MED ORDER — OXYCODONE HCL ER 10 MG PO T12A
10.0000 mg | EXTENDED_RELEASE_TABLET | Freq: Two times a day (BID) | ORAL | Status: DC
  Administered 2013-02-12 – 2013-02-14 (×4): 10 mg via ORAL
  Filled 2013-02-12 (×4): qty 1

## 2013-02-12 MED ORDER — SODIUM CHLORIDE 0.9 % IV SOLN
INTRAVENOUS | Status: DC
  Administered 2013-02-12: 75 mL/h via INTRAVENOUS

## 2013-02-12 MED ORDER — ONDANSETRON HCL 4 MG/2ML IJ SOLN
INTRAMUSCULAR | Status: DC | PRN
  Administered 2013-02-12: 4 mg via INTRAVENOUS

## 2013-02-12 MED ORDER — FENTANYL CITRATE 0.05 MG/ML IJ SOLN
INTRAMUSCULAR | Status: DC | PRN
  Administered 2013-02-12: 50 ug via INTRAVENOUS
  Administered 2013-02-12: 100 ug via INTRAVENOUS
  Administered 2013-02-12: 50 ug via INTRAVENOUS
  Administered 2013-02-12: 100 ug via INTRAVENOUS
  Administered 2013-02-12: 50 ug via INTRAVENOUS

## 2013-02-12 MED ORDER — PROPOFOL 10 MG/ML IV BOLUS
INTRAVENOUS | Status: DC | PRN
  Administered 2013-02-12: 190 mg via INTRAVENOUS

## 2013-02-12 MED ORDER — METOCLOPRAMIDE HCL 10 MG PO TABS: 5.0000 mg | ORAL_TABLET | Freq: Three times a day (TID) | ORAL | Status: DC | PRN

## 2013-02-12 MED ORDER — ACETAMINOPHEN 325 MG PO TABS
650.0000 mg | ORAL_TABLET | Freq: Four times a day (QID) | ORAL | Status: DC | PRN
  Administered 2013-02-13: 650 mg via ORAL
  Filled 2013-02-12: qty 2

## 2013-02-12 MED ORDER — MIDAZOLAM HCL 5 MG/5ML IJ SOLN
INTRAMUSCULAR | Status: DC | PRN
  Administered 2013-02-12 (×2): 1 mg via INTRAVENOUS

## 2013-02-12 MED ORDER — LIDOCAINE HCL (CARDIAC) 20 MG/ML IV SOLN
INTRAVENOUS | Status: DC | PRN
  Administered 2013-02-12: 80 mg via INTRAVENOUS

## 2013-02-12 SURGICAL SUPPLY — 57 items
BANDAGE GAUZE ELAST BULKY 4 IN (GAUZE/BANDAGES/DRESSINGS) IMPLANT
BLADE SAW SGTL 18X1.27X75 (BLADE) ×2 IMPLANT
BLADE SURG ROTATE 9660 (MISCELLANEOUS) IMPLANT
CAPT HIP PF COP ×2 IMPLANT
CELLS DAT CNTRL 66122 CELL SVR (MISCELLANEOUS) ×1 IMPLANT
CLOSURE STERI-STRIP 1/4X4 (GAUZE/BANDAGES/DRESSINGS) ×2 IMPLANT
CLOTH BEACON ORANGE TIMEOUT ST (SAFETY) ×2 IMPLANT
COVER BACK TABLE 24X17X13 BIG (DRAPES) IMPLANT
COVER SURGICAL LIGHT HANDLE (MISCELLANEOUS) ×2 IMPLANT
DERMABOND ADHESIVE PROPEN (GAUZE/BANDAGES/DRESSINGS) ×1
DERMABOND ADVANCED (GAUZE/BANDAGES/DRESSINGS) ×1
DERMABOND ADVANCED .7 DNX12 (GAUZE/BANDAGES/DRESSINGS) ×1 IMPLANT
DERMABOND ADVANCED .7 DNX6 (GAUZE/BANDAGES/DRESSINGS) ×1 IMPLANT
DRAPE C-ARM 42X72 X-RAY (DRAPES) ×2 IMPLANT
DRAPE STERI IOBAN 125X83 (DRAPES) ×2 IMPLANT
DRAPE U-SHAPE 47X51 STRL (DRAPES) ×6 IMPLANT
DRSG AQUACEL AG ADV 3.5X10 (GAUZE/BANDAGES/DRESSINGS) ×2 IMPLANT
DURAPREP 26ML APPLICATOR (WOUND CARE) ×2 IMPLANT
ELECT BLADE 4.0 EZ CLEAN MEGAD (MISCELLANEOUS)
ELECT BLADE TIP CTD 4 INCH (ELECTRODE) ×2 IMPLANT
ELECT CAUTERY BLADE 6.4 (BLADE) ×2 IMPLANT
ELECT REM PT RETURN 9FT ADLT (ELECTROSURGICAL) ×2
ELECTRODE BLDE 4.0 EZ CLN MEGD (MISCELLANEOUS) IMPLANT
ELECTRODE REM PT RTRN 9FT ADLT (ELECTROSURGICAL) ×1 IMPLANT
FACESHIELD LNG OPTICON STERILE (SAFETY) ×4 IMPLANT
GAUZE XEROFORM 1X8 LF (GAUZE/BANDAGES/DRESSINGS) ×2 IMPLANT
GLOVE BIOGEL PI IND STRL 8 (GLOVE) ×2 IMPLANT
GLOVE BIOGEL PI INDICATOR 8 (GLOVE) ×2
GLOVE ECLIPSE 8.0 STRL XLNG CF (GLOVE) ×2 IMPLANT
GLOVE SURG ORTHO 8.0 STRL STRW (GLOVE) ×2 IMPLANT
GOWN STRL REIN XL XLG (GOWN DISPOSABLE) ×4 IMPLANT
HANDPIECE INTERPULSE COAX TIP (DISPOSABLE) ×1
KIT BASIN OR (CUSTOM PROCEDURE TRAY) ×2 IMPLANT
KIT ROOM TURNOVER OR (KITS) ×2 IMPLANT
MANIFOLD NEPTUNE II (INSTRUMENTS) ×2 IMPLANT
NS IRRIG 1000ML POUR BTL (IV SOLUTION) ×2 IMPLANT
PACK TOTAL JOINT (CUSTOM PROCEDURE TRAY) ×2 IMPLANT
PAD ARMBOARD 7.5X6 YLW CONV (MISCELLANEOUS) ×4 IMPLANT
RTRCTR WOUND ALEXIS 18CM MED (MISCELLANEOUS) ×2
SET HNDPC FAN SPRY TIP SCT (DISPOSABLE) ×1 IMPLANT
SPONGE LAP 18X18 X RAY DECT (DISPOSABLE) IMPLANT
SPONGE LAP 4X18 X RAY DECT (DISPOSABLE) IMPLANT
STAPLER VISISTAT 35W (STAPLE) ×2 IMPLANT
SUT ETHIBOND NAB CT1 #1 30IN (SUTURE) ×4 IMPLANT
SUT ETHILON 4 0 PS 2 18 (SUTURE) ×2 IMPLANT
SUT MNCRL AB 3-0 PS2 18 (SUTURE) ×2 IMPLANT
SUT MNCRL AB 3-0 PS2 27 (SUTURE) ×2 IMPLANT
SUT VIC AB 0 CT1 27 (SUTURE) ×2
SUT VIC AB 0 CT1 27XBRD ANBCTR (SUTURE) ×2 IMPLANT
SUT VIC AB 1 CT1 27 (SUTURE) ×2
SUT VIC AB 1 CT1 27XBRD ANBCTR (SUTURE) ×2 IMPLANT
SUT VIC AB 2-0 CT1 27 (SUTURE) ×2
SUT VIC AB 2-0 CT1 TAPERPNT 27 (SUTURE) ×2 IMPLANT
TOWEL OR 17X24 6PK STRL BLUE (TOWEL DISPOSABLE) ×2 IMPLANT
TOWEL OR 17X26 10 PK STRL BLUE (TOWEL DISPOSABLE) ×2 IMPLANT
TRAY FOLEY CATH 16FRSI W/METER (SET/KITS/TRAYS/PACK) IMPLANT
WATER STERILE IRR 1000ML POUR (IV SOLUTION) ×4 IMPLANT

## 2013-02-12 NOTE — Brief Op Note (Signed)
02/12/2013  3:18 PM  PATIENT:  Gasper Sells  57 y.o. male  PRE-OPERATIVE DIAGNOSIS:  Severe osteoarthritis left hip  POST-OPERATIVE DIAGNOSIS:  Severe osteoarthritis left hip  PROCEDURE:  Procedure(s): LEFT TOTAL HIP ARTHROPLASTY ANTERIOR APPROACH (Left)  SURGEON:  Surgeon(s) and Role:    * Kathryne Hitch, MD - Primary  PHYSICIAN ASSISTANT: Rexene Edison, PA-C  ANESTHESIA:   general  EBL:  Total I/O In: 1050 [I.V.:1050] Out: 600 [Blood:600]  BLOOD ADMINISTERED:none  DRAINS: none   LOCAL MEDICATIONS USED:  NONE  SPECIMEN:  No Specimen  DISPOSITION OF SPECIMEN:  N/A  COUNTS:  YES  TOURNIQUET:  * No tourniquets in log *  DICTATION: .Other Dictation: Dictation Number [id number]  PLAN OF CARE: Admit to inpatient   PATIENT DISPOSITION:  PACU - hemodynamically stable.   Delay start of Pharmacological VTE agent (>24hrs) due to surgical blood loss or risk of bleeding: no

## 2013-02-12 NOTE — Transfer of Care (Signed)
Immediate Anesthesia Transfer of Care Note  Patient: Victor Snyder  Procedure(s) Performed: Procedure(s): LEFT TOTAL HIP ARTHROPLASTY ANTERIOR APPROACH (Left)  Patient Location: PACU  Anesthesia Type:General  Level of Consciousness: sedated  Airway & Oxygen Therapy: Patient Spontanous Breathing and Patient connected to nasal cannula oxygen  Post-op Assessment: Report given to PACU RN and Post -op Vital signs reviewed and stable  Post vital signs: Reviewed and stable  Complications: No apparent anesthesia complications

## 2013-02-12 NOTE — H&P (Signed)
TOTAL HIP ADMISSION H&P  Patient is admitted for left total hip arthroplasty.  Subjective:  Chief Complaint: left hip pain  HPI: Victor Snyder, 57 y.o. male, has a history of pain and functional disability in the left hip(s) due to arthritis and patient has failed non-surgical conservative treatments for greater than 12 weeks to include NSAID's and/or analgesics and steroid injections as well as modified activities.  Onset of symptoms was gradual starting 2 years ago with gradually worsening course since that time.The patient noted no past surgery on the left hip(s).  Patient currently rates pain in the left hip at 10 out of 10 with activity. Patient has night pain, worsening of pain with activity and weight bearing, trendelenberg gait, pain that interfers with activities of daily living and pain with passive range of motion. Patient has evidence of subchondral cysts, subchondral sclerosis, periarticular osteophytes and joint space narrowing by imaging studies. This condition presents safety issues increasing the risk of falls.  There is no current active infection.  Patient Active Problem List   Diagnosis Date Noted  . Degenerative arthritis of hip 02/12/2013   History reviewed. No pertinent past medical history.  Past Surgical History  Procedure Laterality Date  . Knee arthroscopy Right 1998 or 1999  . Lumbar laminectomy/decompression microdiscectomy  01/27/2012    Procedure: LUMBAR LAMINECTOMY/DECOMPRESSION MICRODISCECTOMY;  Surgeon: Barnett Abu, MD;  Location: [facility];  Service: Neurosurgery;  Laterality: Left;  Left Lumbar five sacral one  Microdiskectomy    Prescriptions prior to admission  Medication Sig Dispense Refill  . meloxicam (MOBIC) 15 MG tablet Take 15 mg by mouth daily.      . mupirocin ointment (BACTROBAN) 2 % Place into the nose 2 (two) times daily. Patient due for final dose tonight       No Known Allergies  History  Substance Use Topics  . Smoking status:  Light Tobacco Smoker -- 1.00 packs/day for 30 years    Types: Cigars  . Smokeless tobacco: Not on file     Comment: social drinker  . Alcohol Use: Yes     Comment: 1-2 beers every couple of days    History reviewed. No pertinent family history.   Review of Systems  Musculoskeletal: Positive for joint pain.  All other systems reviewed and are negative.    Objective:  Physical Exam  Constitutional: He is oriented to person, place, and time. He appears well-developed and well-nourished.  HENT:  Head: Normocephalic and atraumatic.  Eyes: EOM are normal. Pupils are equal, round, and reactive to light.  Neck: Normal range of motion. Neck supple.  Cardiovascular: Normal rate and regular rhythm.   Respiratory: Effort normal and breath sounds normal.  GI: Soft. Bowel sounds are normal.  Musculoskeletal:       Left hip: He exhibits decreased range of motion, decreased strength, bony tenderness and crepitus.  Neurological: He is alert and oriented to person, place, and time.  Skin: Skin is warm and dry.  Psychiatric: He has a normal mood and affect.    Vital signs in last 24 hours: Temp:  [97.6 F (36.4 C)] 97.6 F (36.4 C) (07/15 1102) Pulse Rate:  [59] 59 (07/15 1102) Resp:  [20] 20 (07/15 1102) BP: (139)/(90) 139/90 mmHg (07/15 1102) SpO2:  [99 %] 99 % (07/15 1102)  Labs:   Estimated body mass index is 32.51 kg/(m^2) as calculated from the following:   Height as of 02/04/13: 6\' 2"  (1.88 m).   Weight as of 01/18/12: 114.9  kg (253 lb 4.9 oz).   Imaging Review Plain radiographs demonstrate severe degenerative joint disease of the left hip(s). The bone quality appears to be good for age and reported activity level.  Assessment/Plan:  End stage arthritis, left hip(s)  The patient history, physical examination, clinical judgement of the provider and imaging studies are consistent with end stage degenerative joint disease of the left hip(s) and total hip arthroplasty is deemed  medically necessary. The treatment options including medical management, injection therapy, arthroscopy and arthroplasty were discussed at length. The risks and benefits of total hip arthroplasty were presented and reviewed. The risks due to aseptic loosening, infection, stiffness, dislocation/subluxation,  thromboembolic complications and other imponderables were discussed.  The patient acknowledged the explanation, agreed to proceed with the plan and consent was signed. Patient is being admitted for inpatient treatment for surgery, pain control, PT, OT, prophylactic antibiotics, VTE prophylaxis, progressive ambulation and ADL's and discharge planning.The patient is planning to be discharged home with home health services

## 2013-02-12 NOTE — Preoperative (Signed)
Beta Blockers   Reason not to administer Beta Blockers:Not Applicable 

## 2013-02-12 NOTE — Anesthesia Procedure Notes (Signed)
Procedure Name: Intubation Date/Time: 02/12/2013 1:24 PM Performed by: Darcey Nora B Pre-anesthesia Checklist: Patient identified, Emergency Drugs available, Suction available and Patient being monitored Patient Re-evaluated:Patient Re-evaluated prior to inductionOxygen Delivery Method: Circle system utilized Preoxygenation: Pre-oxygenation with 100% oxygen Intubation Type: IV induction Ventilation: Mask ventilation without difficulty Laryngoscope Size: Mac and 4 Grade View: Grade II Tube type: Oral Tube size: 7.5 mm Number of attempts: 1 Airway Equipment and Method: Stylet Placement Confirmation: ETT inserted through vocal cords under direct vision,  breath sounds checked- equal and bilateral and positive ETCO2 Secured at: 21 (cm at teeth) cm Tube secured with: Tape Dental Injury: Teeth and Oropharynx as per pre-operative assessment

## 2013-02-12 NOTE — Anesthesia Preprocedure Evaluation (Addendum)
Anesthesia Evaluation  Patient identified by MRN, date of birth, ID band Patient awake    Reviewed: Allergy & Precautions, H&P , NPO status , Patient's Chart, lab work & pertinent test results, reviewed documented beta blocker date and time   Airway Mallampati: II TM Distance: >3 FB Neck ROM: full    Dental  (+) Teeth Intact and Dental Advisory Given   Pulmonary Current Smoker,  Occasional cigar         Cardiovascular     Neuro/Psych    GI/Hepatic   Endo/Other  obese  Renal/GU      Musculoskeletal   Abdominal   Peds  Hematology   Anesthesia Other Findings Crowns in back.  Reproductive/Obstetrics                          Anesthesia Physical Anesthesia Plan  ASA: II  Anesthesia Plan: General   Post-op Pain Management:    Induction: Intravenous  Airway Management Planned: Oral ETT  Additional Equipment:   Intra-op Plan:   Post-operative Plan: Extubation in OR  Informed Consent: I have reviewed the patients History and Physical, chart, labs and discussed the procedure including the risks, benefits and alternatives for the proposed anesthesia with the patient or authorized representative who has indicated his/her understanding and acceptance.     Plan Discussed with: CRNA, Anesthesiologist and Surgeon  Anesthesia Plan Comments:         Anesthesia Quick Evaluation

## 2013-02-13 ENCOUNTER — Encounter (HOSPITAL_COMMUNITY): Payer: Self-pay | Admitting: General Practice

## 2013-02-13 LAB — CBC
Platelets: 163 10*3/uL (ref 150–400)
RDW: 13.4 % (ref 11.5–15.5)
WBC: 5 10*3/uL (ref 4.0–10.5)

## 2013-02-13 LAB — BASIC METABOLIC PANEL
Chloride: 101 mEq/L (ref 96–112)
Creatinine, Ser: 0.89 mg/dL (ref 0.50–1.35)
GFR calc Af Amer: >90 mL/min (ref >90)
GFR calc non Af Amer: >90 mL/min (ref >90)
Potassium: 4.7 mEq/L (ref 3.5–5.1)

## 2013-02-13 MED ORDER — OXYCODONE HCL 5 MG PO TABS
10.0000 mg | ORAL_TABLET | ORAL | Status: DC | PRN
  Administered 2013-02-13 (×2): 10 mg via ORAL
  Filled 2013-02-13 (×2): qty 2

## 2013-02-13 NOTE — Progress Notes (Signed)
Subjective: 1 Day Post-Op Procedure(s) (LRB): LEFT TOTAL HIP ARTHROPLASTY ANTERIOR APPROACH (Left) Patient reports pain as mild.  Has been up to the bathroom.  Objective: Vital signs in last 24 hours: Temp:  [97.4 F (36.3 C)-99.6 F (37.6 C)] 99.6 F (37.6 C) (07/16 0650) Pulse Rate:  [59-75] 75 (07/16 0650) Resp:  [10-20] 16 (07/16 0650) BP: (134-155)/(64-90) 146/80 mmHg (07/16 0650) SpO2:  [97 %-100 %] 98 % (07/16 0650)  Intake/Output from previous day: 07/15 0701 - 07/16 0700 In: 2910 [P.O.:360; I.V.:2550] Out: 1100 [Urine:500; Blood:600] Intake/Output this shift:     Recent Labs  02/13/13 0641  HGB 12.6*    Recent Labs  02/13/13 0641  WBC 5.0  RBC 4.10*  HCT 35.0*  PLT 163   No results found for this basename: NA, K, CL, CO2, BUN, CREATININE, GLUCOSE, CALCIUM,  in the last 72 hours No results found for this basename: LABPT, INR,  in the last 72 hours  Sensation intact distally Intact pulses distally Dorsiflexion/Plantar flexion intact Incision: dressing C/D/I  Assessment/Plan: 1 Day Post-Op Procedure(s) (LRB): LEFT TOTAL HIP ARTHROPLASTY ANTERIOR APPROACH (Left) Up with therapy  Jhaden Pizzuto Y 02/13/2013, 7:50 AM

## 2013-02-13 NOTE — Progress Notes (Signed)
Physical Therapy Treatment Patient Details Name: Victor Snyder MRN: [id number] DOB: 02-22-1956 Today's Date: 02/13/2013 Time: 8657-8469 PT Time Calculation (min): 25 min  PT Assessment / Plan / Recommendation  PT Comments   Pt presents at/near modified independent all activity except in/out of bed, and spouse able to provide this assistance.  Walked perimeter of unit without difficulty and demonstrated safe stair climbing.  D/w pt and spouse d/c needs and may not require HH services due to quick progress and pt agreeable to discuss with MD.  PT plans to return in am to assess d/c needs and likely plan to discharge acute services due to functional goals met.   Follow Up Recommendations  Home health PT;Outpatient PT (may not need HH service, pt to d/w MD and PT next date)     Does the patient have the potential to tolerate intense rehabilitation     Barriers to Discharge        Equipment Recommendations       Recommendations for Other Services    Frequency 7X/week   Progress towards PT Goals Progress towards PT goals: Progressing toward goals  Plan Current plan remains appropriate    Precautions / Restrictions Precautions Precautions: Anterior Hip Restrictions LLE Weight Bearing: Weight bearing as tolerated   Pertinent Vitals/Pain Well controlled pain     Mobility  Bed Mobility Bed Mobility: Sit to Supine Sit to Supine: 4: Min assist;HOB flat Details for Bed Mobility Assistance: physical assist and demonstration cues for spouse to assist operated limb onto bed surface Transfers Transfers: Stand to Sit;Sit to Stand Sit to Stand: 6: Modified independent (Device/Increase time);With upper extremity assist;From bed;From chair/3-in-1 Stand to Sit: 6: Modified independent (Device/Increase time);With upper extremity assist;To bed;To chair/3-in-1 Ambulation/Gait Ambulation/Gait Assistance: 6: Modified independent (Device/Increase time) Ambulation Distance (Feet): 600  Feet Assistive device: Rolling walker Ambulation/Gait Assistance Details: walked perimeter of unit with occassional cues to increase upright posture and reduce stress through UE and shoulders, otherwise pt is safe with RW and using functional gait pattern Gait Pattern: Step-through pattern;Decreased stance time - left;Trunk flexed Stairs: Yes Stairs Assistance: 6: Modified independent (Device/Increase time) Stairs Assistance Details (indicate cue type and reason): confirmational cues for step to technique and demonstration for spouse to assist at home.  Discussed concern with accessing 2nd floor of home and expect pt will have no limitations using step to pattern... also discussed concern of accessing trunk to ride home and verbally instructed on best technique Stair Management Technique: Two rails;Step to pattern;Forwards Number of Stairs: 5    Exercises Total Joint Exercises Ankle Circles/Pumps: Strengthening;Both;10 reps;Supine Quad Sets: Strengthening;Left;10 reps;Supine Heel Slides: AAROM;Left;5 reps;Supine   PT Diagnosis:    PT Problem List:   PT Treatment Interventions:     PT Goals (current goals can now be found in the care plan section)    Visit Information  Last PT Received On: 02/13/13 Assistance Needed: +1 History of Present Illness: 57 yo s/p left THA anterior approach, WBAT    Subjective Data      Cognition  Cognition Arousal/Alertness: Awake/alert Behavior During Therapy: WFL for tasks assessed/performed Overall Cognitive Status: Within Functional Limits for tasks assessed    Balance     End of Session PT - End of Session Activity Tolerance: Patient tolerated treatment well Patient left: in bed;with call bell/phone within reach;with family/visitor present   GP     Dennis Bast 02/13/2013, 4:09 PM

## 2013-02-13 NOTE — Op Note (Signed)
Victor Snyder NO.:  [id number]  MEDICAL RECORD NO.:  [id number]  LOCATION:  [id number]                        FACILITY:  [facility]  PHYSICIAN:  Vanita Panda. Magnus Ivan, M.D.DATE OF BIRTH:  07/28/1956  DATE OF PROCEDURE:  02/12/2013 DATE OF DISCHARGE:                              OPERATIVE REPORT   PREOPERATIVE DIAGNOSES:  Severe end-stage arthritis and degenerative joint disease, left hip.  POSTOPERATIVE DIAGNOSES:  Severe end-stage arthritis and degenerative joint disease, left hip.  PROCEDURE:  Left total hip arthroplasty through the direct anterior approach.  COMPONENTS:  DePuy Sector Gription acetabular component size 56, size 36+ 4 neutral polyethylene liner, size 11 Corail femoral component with standard offset, size 36+ 5 ceramic hip ball.  SURGEON:  Vanita Panda. Magnus Ivan, M.D.  ASSISTING:  [name], PA-C.  ANESTHESIA:  General.  ANTIBIOTICS:  2 g of IV Ancef.  BLOOD LOSS:  500-600 mL.  COMPLICATIONS:  None.  INDICATIONS:  Victor Snyder is a 57 year old gentleman with severe end- stage arthritis of both of his hips with the left being much worse than the right.  Victor Snyder has been dealing with this pain in his hip for several years now and has had multiple steroid injections in the hip, activity modification, and been on anti-inflammatories.  It was gotten to the point where his x-ray showed complete loss of the hip joint.  There was collapse of the femoral head.  There was periarticular osteophytes, subchondral sclerosis, and cystic changes.  At this point with the failure of conservative treatment, the daily pain Victor Snyder is having and as well as the decrease in his activities of daily living, Victor Snyder wished to proceed with the total hip arthroplasty.  I have discussed in detail with Victor Snyder the risks and benefits of surgery including the risks of acute blood loss anemia, nerve and vessel injury, infection, fracture, and DVT.  The goals are to improve  mobility, decrease pain and improve quality of life.  PROCEDURE DESCRIPTION:  After informed consent was obtained, appropriate left leg was marked.  Victor Snyder was brought to the operating room and general anesthesia was obtained while Victor Snyder was on the stretcher.  Traction boots were placed on both of his feet and Victor Snyder was placed supine on the Hana fracture table with the perineal post in place and both legs in inline skeletal traction, but no traction applied.  His left operative hip was then prepped and draped with DuraPrep and sterile drapes.  A time-out was called to identify correct patient and correct left hip.  We then made an incision just posterior and inferior to the anterior superior iliac spine and carried this obliquely down the leg.  I dissected down to the soft tissue to the tensor fascia lata muscle and the tensor fascia was divided longitudinally.  I then proceeded with a direct anterior approach to the hip.  A Cobra retractor was placed around the lateral neck and up underneath the rectus femoris.  A Cobra retractor was placed medially.  I cauterized the lateral femoral circumflex vessels and then opened up the hip capsule in L-type format, encountering large joint effusion.  I placed the Cobra retractors within the hip capsule and then made  my femoral neck cut almost at the level of the calcar due to a short neck.  I did this on oscillating saw and completed with an osteotome.  I then placed a corkscrew guide in the femoral head and removed the femoral head in its entirety.  Next, I proceeded with the acetabular component.  I removed the femoral head and found it to be devoid of cartilage.  I placed a bent Hohmann medially and a Cobra retractor laterally.  I released the transverse acetabular ligament and removed remnants of acetabular labrum.  I then began reaming in 2-mm increments under direct visualization from size 40 to all the way up to the size 56 with all reamers placed  under direct visualization.  The last reamer was placed under direct fluoroscopy, so we could obtain my depth of reaming, my inclination, and anteversion. Once I was pleased with this, I placed the real DePuy Gription acetabular component size 56, the apex hole eliminator guide and the real 56+ 4 neutral polyethylene liner.  Next, attention was turned to the femur.  With the leg externally rotate to about 90 degrees, extended and adducted, I was able to place a Mueller retractor medially and Hohmann retractor behind the greater trochanter.  I released the lateral joint capsule and then used a box cutting osteotome, the femoral canal and a rongeur to lateralize.  I then began broaching from the size 8, broach up to a size 11 and 11 was nice and tight with slightly proud, which I was okay with given the low neck cut.  I trialed a standard femoral neck with a 36+ 5 hip ball given the short in nature.  We brought the leg back over and up, and with traction and internal rotation, reducing the pelvis and it was stable with rotation and leg lengths were measured equal under direct fluoroscopy.  We then dislocated the hip and removed all trial components.  We placed the real size 11 Corail femoral component and the real 36+ 5 ceramic hip ball. We reduced it the again into the acetabulum and it was stable.  We verified this under direct fluoroscopy and motion.  We measured his leg lengths to be near equal.  We then copiously irrigated the soft tissue with normal saline solution using pulsatile lavage.  We closed the joint capsule with interrupted #1 Ethibond suture followed by running #1 Vicryl and the tensor fascia, 0 Vicryl in the deep tissue, 2-0 Vicryl in the subcutaneous tissue, 4-0 Monocryl in the subcuticular stitch and Dermabond on the skin.  An Aquacel dressing was applied and Victor Snyder was taken off the Hana table, awakened, extubated, and taken to the recovery room in stable condition.  All  final counts were correct.  There were no complications noted.  Of note, [name], PA-C was present the entire case and his presence was crucial for patient positioning, retracting and closure of the wound.     Vanita Panda. Magnus Ivan, M.D.     CYB/MEDQ  D:  02/12/2013  T:  02/13/2013  Job:  [id number]

## 2013-02-13 NOTE — Anesthesia Postprocedure Evaluation (Signed)
  Anesthesia Post-op Note  Patient: Victor Snyder  Procedure(s) Performed: Procedure(s): LEFT TOTAL HIP ARTHROPLASTY ANTERIOR APPROACH (Left)  Patient Location: Nursing Unit  Anesthesia Type:General  Level of Consciousness: awake, alert  and oriented  Airway and Oxygen Therapy: Patient Spontanous Breathing  Post-op Pain: mild  Post-op Assessment: Post-op Vital signs reviewed, Patient's Cardiovascular Status Stable, Respiratory Function Stable, No signs of Nausea or vomiting, Adequate PO intake and Pain level controlled  Post-op Vital Signs: Reviewed and stable  Complications: No apparent anesthesia complications

## 2013-02-13 NOTE — Evaluation (Signed)
Physical Therapy Evaluation Patient Details Name: Victor Snyder MRN: [id number] DOB: [birth year] Today's Date: 02/13/2013 Time: 7829-5621 PT Time Calculation (min): 47 min  PT Assessment / Plan / Recommendation History of Present Illness  57 yo s/p left THA anterior approach, WBAT  Clinical Impression  Pt presents at mostly supervision level for functional mobility, needs instruction for safe movement to protect surgical hip and education on postoperative exercises.  Able to demonstrate stairs with minimal difficulty and expect speedy progress for goal of returning home at d/c.  Plan for HHPT currently, however anticipate quick transition to OPPT.    PT Assessment  Patient needs continued PT services    Follow Up Recommendations  Home health PT;Supervision - Intermittent    Does the patient have the potential to tolerate intense rehabilitation      Barriers to Discharge        Equipment Recommendations  Rolling walker with 5" wheels;3in1 (PT)    Recommendations for Other Services     Frequency 7X/week    Precautions / Restrictions Precautions Precautions: Anterior Hip Precaution Booklet Issued: Yes (comment) Precaution Comments: one sheet handout with ant precautions illustrated and explained to pt Restrictions Weight Bearing Restrictions: Yes LLE Weight Bearing: Weight bearing as tolerated   Pertinent Vitals/Pain 6/10, premedicated 90 minutes prior; minimal related limitations      Mobility  Bed Mobility Bed Mobility: Supine to Sit Supine to Sit: 4: Min assist;HOB elevated (20 degrees) Details for Bed Mobility Assistance: verbal instruction to self assist operated limb, to exit bed to right to protect anterior approach left hip, and incr time/effort due to pain Transfers Transfers: Stand to Sit;Sit to Stand Sit to Stand: 5: Supervision;From bed;With upper extremity assist Stand to Sit: 5: Supervision;With upper extremity assist;To chair/3-in-1 Details for Transfer  Assistance: verbal instruction for optimal safe hand position as pt tends to push up on walker to stand; cues to alighn with sitting surface and control hip postion and speed of descent.   Ambulation/Gait Ambulation/Gait Assistance: 5: Supervision Ambulation Distance (Feet): 230 Feet Assistive device: Rolling walker Ambulation/Gait Assistance Details: verbal instruction to continue step-through patterning, to remain closer to RW to prevent forward flexion and excessive lean on walker.   Gait Pattern: Step-through pattern;Decreased stance time - left;Antalgic General Gait Details: immediately uses step through pattern, tends to move a bit impulsively without regard for hip precautions including turning toward operated leg -- pt cautioned and verbalized understanding Stairs: Yes Stairs Assistance: 5: Supervision Stairs Assistance Details (indicate cue type and reason): verbal instruction for optimal sequencing, educated on risk of reciprocal patterns and descent with sound leg first, pt verbalized understanding Stair Management Technique: Two rails;Step to pattern;Alternating pattern;Forwards Number of Stairs: 5    Exercises Total Joint Exercises Ankle Circles/Pumps: Strengthening;Both;10 reps;Seated;Supine [employer] Sets: Strengthening;Left;10 reps;Supine Long Arc Quad: Strengthening;Left;10 reps;Seated Knee Flexion: Strengthening;Left;5 reps;Standing Marching in Standing: AROM;5 reps;Left;Standing   PT Diagnosis: Acute pain;Difficulty walking  PT Problem List: Decreased range of motion;Decreased strength;Pain;Decreased knowledge of precautions;Decreased knowledge of use of DME;Decreased mobility PT Treatment Interventions: Patient/family education;Therapeutic exercise;Therapeutic activities;Functional mobility training;Stair training;Gait training;DME instruction     PT Goals(Current goals can be found in the care plan section) Acute Rehab PT Goals Patient Stated Goal: go home PT Goal  Formulation: With patient Time For Goal Achievement: 02/20/13 Potential to Achieve Goals: Good  Visit Information  Last PT Received On: 02/13/13 Assistance Needed: +1 History of Present Illness: 57 yo s/p left THA anterior approach, WBAT  Prior Functioning  Home Living Family/patient expects to be discharged to:: Private residence Living Arrangements: Spouse/significant other Available Help at Discharge: Family;Available 24 hours/day Type of Home: House Home Access: Stairs to enter [employer] of Steps: 6 Entrance Stairs-Rails: Right;Can reach both Home Layout: Multi-level;Able to live on main level with bedroom/bathroom Alternate Level Stairs-Number of Steps: 15 (max if pt chooses to go to alternate floor) Alternate Level Stairs-Rails: Can reach both Home Equipment: None Prior Function Level of Independence: Independent Communication Communication: No difficulties Dominant Hand: Right    Cognition  Cognition Arousal/Alertness: Awake/alert Behavior During Therapy: WFL for tasks assessed/performed Overall Cognitive Status: Within Functional Limits for tasks assessed    Extremity/Trunk Assessment Upper Extremity Assessment Upper Extremity Assessment: Overall WFL for tasks assessed;Defer to OT evaluation Lower Extremity Assessment Lower Extremity Assessment: LLE deficits/detail LLE Deficits / Details: left total hip, pain and ROM restrictions LLE: Unable to fully assess due to pain (3-/5 hip flexion standing.  >/=3+/5 knee, 5/5 ankle)   Balance    End of Session PT - End of Session Equipment Utilized During Treatment: Gait belt Activity Tolerance: Patient tolerated treatment well Patient left: in chair;with call bell/phone within reach Nurse Communication: Mobility status  GP     Dennis Bast 02/13/2013, 11:34 AM

## 2013-02-13 NOTE — Progress Notes (Signed)
UR COMPLETED  

## 2013-02-14 ENCOUNTER — Encounter (HOSPITAL_COMMUNITY): Payer: Self-pay | Admitting: Orthopaedic Surgery

## 2013-02-14 LAB — CBC
HCT: 35 % — ABNORMAL LOW (ref 39.0–52.0)
Hemoglobin: 12.7 g/dL — ABNORMAL LOW (ref 13.0–17.0)
RDW: 13.2 % (ref 11.5–15.5)
WBC: 5.7 10*3/uL (ref 4.0–10.5)

## 2013-02-14 MED ORDER — OXYCODONE-ACETAMINOPHEN 5-325 MG PO TABS: 1.0000 | ORAL_TABLET | ORAL | Status: DC | PRN

## 2013-02-14 MED ORDER — METHOCARBAMOL 500 MG PO TABS: 500.0000 mg | ORAL_TABLET | Freq: Four times a day (QID) | ORAL | Status: DC | PRN

## 2013-02-14 MED ORDER — ASPIRIN 325 MG PO TBEC: 325.0000 mg | DELAYED_RELEASE_TABLET | Freq: Two times a day (BID) | ORAL | Status: DC

## 2013-02-14 NOTE — Evaluation (Signed)
Occupational Therapy Evaluation Patient Details Name: Victor Snyder MRN: [id number] DOB: [birth year] Today's Date: 02/14/2013 Time: 4098-1191 OT Time Calculation (min): 25 min  OT Assessment / Plan / Recommendation History of present illness 57 yo s/p left THA anterior approach, WBAT   Clinical Impression   Patient presents with overall Mod I-Supervision with mobility and min-mod assist for BADL tasks.  Patient declines need for follow up OT as he states that he will have his wife or 57 yr old daughter to assist when needed.  Patient required VCs to adhere to hip precautions during functional mobility and when demonstrated LB BADL tasks.  Patient does not appear to take his hip precautions seriously stating that he does not hurt when he does them and that his surgeon does not seem to think he is at risk for joint dislocation.  Encouraged patient to adhere the precautions anyway.  Patient to discharge home today per MD and no further OT services needed at this time.    OT Assessment  Patient does not need any further OT services    Follow Up Recommendations  No OT follow up    Equipment Recommendations  3 in 1 bedside comode (declined to consider shower DME)    Precautions / Restrictions Precautions Precautions: Anterior Hip Precaution Comments: one sheet handout with ant precautions illustrated and explained to pt by PT yesterday and explained again by this clinician today. Restrictions LLE Weight Bearing: Weight bearing as tolerated   Pertinent Vitals/Pain Denies pain    ADL  Lower Body Bathing: Simulated;Minimal assistance Where Assessed - Lower Body Bathing: Supported standing;Unsupported sitting Lower Body Dressing: Performed;Simulated;Moderate assistance Where Assessed - Lower Body Dressing: Unsupported sitting;Supported standing Transfers/Ambulation Related to ADLs: Mod I with stand step transfer with RW, vcs for adhereing to ant hip precautions during bed mobility and with  stand to sit on low surface.   ADL Comments: Patient declined to consider AE to improve independence then stated that he would ask his wife or his 43 year old daughter to assist with any ADL tasks he finds he is unable to do.     Patient Stated Goal: go home today per MD  Visit Information  Last OT Received On: 02/14/13 Assistance Needed: +1 History of Present Illness: 58 yo s/p left THA anterior approach, WBAT       Prior Functioning     Home Living Family/patient expects to be discharged to:: Private residence Living Arrangements: Spouse/significant other;Children Available Help at Discharge: Family;Available 24 hours/day Type of Home: House Home Access: Stairs to enter [employer] of Steps: 6 Entrance Stairs-Rails: Right;Can reach both Home Layout: Multi-level;Able to live on main level with bedroom/bathroom Home Equipment: None Additional Comments: 3 in 1 has been delivered to patient's room to take home today.  Discussed methods of getting into and out of his tub/shower combo and use of tub bench.  Patient declines the need for any shoer DME and states that if he needs it after he gets home, he will get one. Prior Function Level of Independence: Independent Communication Communication: No difficulties Dominant Hand: Right    Cognition  Cognition Arousal/Alertness: Awake/alert Behavior During Therapy: WFL for tasks assessed/performed Overall Cognitive Status: Within Functional Limits for tasks assessed    Extremity/Trunk Assessment Upper Extremity Assessment Upper Extremity Assessment: Overall WFL for tasks assessed Lower Extremity Assessment Lower Extremity Assessment: Defer to PT evaluation Cervical / Trunk Assessment Cervical / Trunk Assessment: Normal     Mobility Bed Mobility Bed Mobility: Sit to  Supine Sit to Supine: HOB flat;5: Supervision Details for Bed Mobility Assistance: vcs needed for patient to adhere to hip precautions. Transfers Sit  to Stand: 6: Modified independent (Device/Increase time);With upper extremity assist;From bed Stand to Sit: With upper extremity assist;To chair/3-in-1;5: Supervision (low surface) Details for Transfer Assistance: vcs needed for patient to adhere to hip precautions.     End of Session OT - End of Session Equipment Utilized During Treatment: Rolling walker Activity Tolerance: Patient tolerated treatment well Patient left: in chair  GO     Nikalas Bramel 02/14/2013, 9:01 AM

## 2013-02-14 NOTE — Progress Notes (Signed)
Subjective: 2 Days Post-Op Procedure(s) (LRB): LEFT TOTAL HIP ARTHROPLASTY ANTERIOR APPROACH (Left) Patient reports pain as mild.    Objective: Vital signs in last 24 hours: Temp:  [98.1 F (36.7 C)-102.3 F (39.1 C)] 100.2 F (37.9 C) (07/17 4782) Pulse Rate:  [73-85] 73 (07/17 0638) Resp:  [16] 16 (07/17 0638) BP: (112-137)/(67-73) 112/71 mmHg (07/17 0638) SpO2:  [93 %-97 %] 97 % (07/17 9562)  Intake/Output from previous day: 07/16 0701 - 07/17 0700 In: 360 [P.O.:360] Out: 1800 [Urine:1800] Intake/Output this shift:     Recent Labs  02/13/13 0641 02/14/13 0710  HGB 12.6* 12.7*    Recent Labs  02/13/13 0641 02/14/13 0710  WBC 5.0 5.7  RBC 4.10* 4.11*  HCT 35.0* 35.0*  PLT 163 165    Recent Labs  02/13/13 0641  NA 136  K 4.7  CL 101  CO2 28  BUN 10  CREATININE 0.89  GLUCOSE 137*  CALCIUM 8.9   No results found for this basename: LABPT, INR,  in the last 72 hours  Sensation intact distally Intact pulses distally Dorsiflexion/Plantar flexion intact Incision: dressing C/D/I  Assessment/Plan: 2 Days Post-Op Procedure(s) (LRB): LEFT TOTAL HIP ARTHROPLASTY ANTERIOR APPROACH (Left) Discharge home with home health  Kathryne Hitch 02/14/2013, 8:37 AM

## 2013-02-14 NOTE — Care Management Note (Signed)
    Page 1 of 1   02/14/2013     1:38:45 PM   CARE MANAGEMENT NOTE 02/14/2013  Patient:  Victor Snyder, Victor Snyder   Account Number:  [id number]  Date Initiated:  02/14/2013  Documentation initiated by:  [facility]  Subjective/Objective Assessment:   admitted postop left total hip     Action/Plan:   PT/OT evals-recommended outpatient PT   Anticipated DC Date:  02/14/2013   Anticipated DC Plan:  HOME/SELF CARE      DC Planning Services  CM consult      Choice offered to / List presented to:     DME arranged  3-N-1  [facility]      DME agency  [employer]        Status of service:  Completed, signed off Medicare Important Message given?   (If response is "NO", the following Medicare IM given date fields will be blank) Date Medicare IM given:   Date Additional Medicare IM given:    Discharge Disposition:  HOME/SELF CARE  Per UR Regulation:    If discussed at Long Length of Stay Meetings, dates discussed:    Comments:

## 2013-02-14 NOTE — Discharge Summary (Signed)
Patient ID: Victor Snyder MRN: [id number] DOB/AGE: 59-Jul-1957 59 y.o.  Admit date: 02/12/2013 Discharge date: 02/14/2013  Admission Diagnoses:  Principal Problem:   Degenerative arthritis of hip   Discharge Diagnoses:  Same  Past Medical History  Diagnosis Date  . Arthritis     OSTEO     Surgeries: Procedure(s): LEFT TOTAL HIP ARTHROPLASTY ANTERIOR APPROACH on 02/12/2013   Consultants:    Discharged Condition: Improved  Hospital Course: Victor Snyder is an 57 y.o. male who was admitted 02/12/2013 for operative treatment ofDegenerative arthritis of hip. Patient has severe unremitting pain that affects sleep, daily activities, and work/hobbies. After pre-op clearance the patient was taken to the operating room on 02/12/2013 and underwent  Procedure(s): LEFT TOTAL HIP ARTHROPLASTY ANTERIOR APPROACH.    Patient was given perioperative antibiotics: Anti-infectives   Start     Dose/Rate Route Frequency Ordered Stop   02/12/13 1930  ceFAZolin (ANCEF) IVPB 1 g/50 mL premix     1 g 100 mL/hr over 30 Minutes Intravenous Every 6 hours 02/12/13 1627 02/13/13 0142   02/12/13 0600  ceFAZolin (ANCEF) IVPB 2 g/50 mL premix     2 g 100 mL/hr over 30 Minutes Intravenous On call to O.R. 02/11/13 1421 02/12/13 1327       Patient was given sequential compression devices, early ambulation, and chemoprophylaxis to prevent DVT.  Patient benefited maximally from hospital stay and there were no complications.    Recent vital signs: Patient Vitals for the past 24 hrs:  BP Temp Temp src Pulse Resp SpO2  02/14/13 0638 112/71 mmHg 100.2 F (37.9 C) - 73 16 97 %  02/13/13 2217 128/67 mmHg 98.1 F (36.7 C) - 76 16 97 %  02/13/13 1246 137/73 mmHg 102.3 F (39.1 C) Oral 85 16 93 %  02/13/13 1200 - - - - 16 93 %     Recent laboratory studies:  Recent Labs  02/13/13 0641 02/14/13 0710  WBC 5.0 5.7  HGB 12.6* 12.7*  HCT 35.0* 35.0*  PLT 163 165  NA 136  --   K 4.7  --   CL 101  --    CO2 28  --   BUN 10  --   CREATININE 0.89  --   GLUCOSE 137*  --   CALCIUM 8.9  --      Discharge Medications:     Medication List    TAKE these medications       aspirin 325 MG EC tablet  Take 1 tablet (325 mg total) by mouth 2 (two) times daily after a meal.     meloxicam 15 MG tablet  Commonly known as:  MOBIC  Take 15 mg by mouth daily.     methocarbamol 500 MG tablet  Commonly known as:  ROBAXIN  Take 1 tablet (500 mg total) by mouth every 6 (six) hours as needed.     oxyCODONE-acetaminophen 5-325 MG per tablet  Commonly known as:  ROXICET  Take 1-2 tablets by mouth every 4 (four) hours as needed for pain.      ASK your doctor about these medications       mupirocin ointment 2 %  Commonly known as:  BACTROBAN  Place into the nose 2 (two) times daily. Patient due for final dose tonight        Diagnostic Studies: Dg Hip Operative Left  02/12/2013   *RADIOLOGY REPORT*  Clinical Data: Osteoarthritis  OPERATIVE LEFT HIP  Comparison: None.  Findings: C-arm films document  satisfactory position and alignment status post left total hip arthroplasty.  IMPRESSION: As above.   Original Report Authenticated By: Davonna Belling, M.D.   Dg Pelvis Portable  02/12/2013   *RADIOLOGY REPORT*  Clinical Data: Arthroplasty  PORTABLE PELVIS  Comparison: Perioperative exams same day  Findings: Two AP view of the pelvis provided.  No evidence of fracture  or dislocation following left hip arthroplasty.  The inferior portion of the prosthetic is excluded.  This portion was included on comparison lateral single view which had an appended AP view of the pelvis.  IMPRESSION:  No complication following left hip arthroplasty.   Original Report Authenticated By: Genevive Bi, M.D.   Dg Hip Portable 1 View Left  02/12/2013   *RADIOLOGY REPORT*  Clinical Data: Postop left hip  PORTABLE LEFT HIP - 1 VIEW  Comparison: Intraoperative films 02/12/2013  Findings: Left hip total arthroplasty.  No  evidence of fracture or dislocation.  Joint space narrowing of the right hip  IMPRESSION: Left hip arthroplasty without complication.   Original Report Authenticated By: Genevive Bi, M.D.    Disposition: 01-Home or Self Care      Discharge Orders   Future Orders Complete By Expires     Call MD / Call 911  As directed     Comments:      If you experience chest pain or shortness of breath, CALL 911 and be transported to the hospital emergency room.  If you develope a fever above 101 F, pus (white drainage) or increased drainage or redness at the wound, or calf pain, call your surgeon's office.    Constipation Prevention  As directed     Comments:      Drink plenty of fluids.  Prune juice may be helpful.  You may use a stool softener, such as Colace (over the counter) 100 mg twice a day.  Use MiraLax (over the counter) for constipation as needed.    Diet - low sodium heart healthy  As directed     Discharge instructions  As directed     Comments:      Increase your activities as comfort allows. Expect left thigh, leg, and foot swelling. You can shower and get your current dressing wet. You should remove your current dressing this coming Tuesday 7/22 and get your actual incision wet in the shower; then new dry dressing daily.    Discharge patient  As directed     Increase activity slowly as tolerated  As directed        Follow-up Information   Follow up with Kathryne Hitch, MD In 2 weeks.   Contact information:   [address] [place] [address] [phone]        Signed: Kathryne Hitch 02/14/2013, 8:41 AM

## 2013-02-14 NOTE — Progress Notes (Signed)
PT Cancellation Note  Patient Details Name: Victor Snyder MRN: [id number] DOB: [birth year]   Cancelled Treatment:    Reason Eval/Treat Not Completed: Patient decline. Reports he does not need to review HEP or amb steps again. Reports he has been walking around independently.  Pt denies need for HHPT at this time. Recommend OPPT to pt. Will sign off at this time.    Donnamarie Poag [place], [place] [phone] 02/14/2013, 10:40 AM

## 2015-01-06 ENCOUNTER — Encounter (HOSPITAL_COMMUNITY): Admission: RE | Payer: Self-pay | Source: Ambulatory Visit

## 2015-01-06 ENCOUNTER — Inpatient Hospital Stay (HOSPITAL_COMMUNITY)
Admission: RE | Admit: 2015-01-06 | Payer: Commercial Managed Care - PPO | Source: Ambulatory Visit | Admitting: Orthopaedic Surgery

## 2015-01-06 SURGERY — ARTHROPLASTY, HIP, TOTAL, ANTERIOR APPROACH
Anesthesia: General | Laterality: Right

## 2016-04-21 ENCOUNTER — Emergency Department (HOSPITAL_COMMUNITY): Payer: PRIVATE HEALTH INSURANCE

## 2016-04-21 ENCOUNTER — Emergency Department (HOSPITAL_COMMUNITY)
Admission: EM | Admit: 2016-04-21 | Discharge: 2016-04-21 | Disposition: A | Discharge status: Home / Self Care | Payer: PRIVATE HEALTH INSURANCE | Attending: Emergency Medicine | Admitting: Emergency Medicine

## 2016-04-21 ENCOUNTER — Encounter (HOSPITAL_COMMUNITY): Payer: Self-pay

## 2016-04-21 DIAGNOSIS — F17290 Nicotine dependence, other tobacco product, uncomplicated: Secondary | ICD-10-CM | POA: Diagnosis not present

## 2016-04-21 DIAGNOSIS — R0789 Other chest pain: Secondary | ICD-10-CM | POA: Insufficient documentation

## 2016-04-21 DIAGNOSIS — Z7982 Long term (current) use of aspirin: Secondary | ICD-10-CM | POA: Diagnosis not present

## 2016-04-21 MED ORDER — NAPROXEN 500 MG PO TABS: ORAL_TABLET | ORAL | 0 refills | Status: DC

## 2016-04-21 MED ORDER — CYCLOBENZAPRINE HCL 5 MG PO TABS: 5.0000 mg | ORAL_TABLET | Freq: Three times a day (TID) | ORAL | 0 refills | Status: DC | PRN

## 2016-04-21 MED ORDER — KETOROLAC TROMETHAMINE 30 MG/ML IJ SOLN
60.0000 mg | Freq: Once | INTRAMUSCULAR | Status: AC
  Administered 2016-04-21: 60 mg via INTRAMUSCULAR
  Filled 2016-04-21: qty 2

## 2016-04-21 NOTE — ED Triage Notes (Addendum)
Pt denies injury or trauma, states left ribs started hurting overnight. Pt states it hurt worse when he turned over in the bed onto his left side.
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
## 2016-04-21 NOTE — Discharge Instructions (Signed)
Use ice and heat over the painful area for comfort. Take the medication as prescribed.  Recheck if you get worse, such as struggling to breathe, feeling short of breathe, getting a cough or fever.  Hopefully, you have a bruise of your chest wall from fixing the car yesterday, and your pain should improve over the next 7-14 days, if it lasts longer than that you may have had a crack in the rib.

## 2016-08-02 ENCOUNTER — Encounter (INDEPENDENT_AMBULATORY_CARE_PROVIDER_SITE_OTHER): Payer: Self-pay

## 2016-08-02 ENCOUNTER — Ambulatory Visit (INDEPENDENT_AMBULATORY_CARE_PROVIDER_SITE_OTHER): Payer: PRIVATE HEALTH INSURANCE | Admitting: Orthopaedic Surgery

## 2016-08-02 DIAGNOSIS — M1611 Unilateral primary osteoarthritis, right hip: Secondary | ICD-10-CM

## 2016-08-02 NOTE — Progress Notes (Signed)
Office Visit Note   Patient: Victor Snyder           Date of Birth: August 27, 1955           MRN: [id number] Visit Date: 08/02/2016              Requested by: No referring provider defined for this encounter. PCP: No PCP Per Patient   Assessment & Plan: Visit Diagnoses:  1. Unilateral primary osteoarthritis, right hip     Plan: He is now having pain in the right hip this affecting his quality of life is failed conservative treatment and time Schedule him for a right total hip arthroplasty direct anterior approach near future. He understands risks benefits including DVT PE wound healing problems increased pain prolonged pain nerve and vessel injury. See him back into 2 weeks postop no x-rays at that time. Questions encouraged and answered by Dr. Magnus Ivan itself.  Follow-Up Instructions: Return in about 2 weeks (around 08/16/2016) for post op.   Orders:  No orders of the defined types were placed in this encounter.  No orders of the defined types were placed in this encounter.     Procedures: No procedures performed   Clinical Data: No additional findings.   Subjective: Chief Complaint  Patient presents with  . Right Hip - Follow-up    Patient wants to re-schedule surgery    HPI Victor Snyder returns today wanting to proceed with a right total hip arthroplasty in the near future. He underwent a left total hip arthroplasty 02/12/2013 has done exceptionally well with the left hip. Had no change in his medical status and no history of PE or DVT. He cannot walk for long distances due to increased pain in the right hip. Denies any waking pain to the right hip but does have cramping in the hip a lot. He uses no assistive devices to ambulate. Has difficulty putting on his lace up boots due to the right hip pain and decreased range of motion. He takes approximately 1000 mg of naproxen per day to help with this right hip pain but this is not totally alleviate his pain. 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  Review of Systems See history of present illness otherwise negative  Objective: Vital Signs: There were no vitals taken for this visit.  Physical Exam  Constitutional: He is oriented to person, place, and time. He appears well-developed and well-nourished.  Eyes: EOM are normal.  Cardiovascular: Intact distal pulses.   Neurological: He is alert and oriented to person, place, and time.  Skin: Skin is warm and dry.  Psychiatric: He has a normal mood and affect.    Ortho Exam   left hip he has excellent range of motion  Right hip good external rotation very limited internal rotation with pain. Leg lengths are equal Specialty Comments:  No specialty comments available.  Imaging: Previous x-rays of the right hip complete loss of the superolateral joint space with significant cystic and osteophyte changes.   PMFS History: Patient Active Problem List   Diagnosis Date Noted  . Degenerative arthritis of hip 02/12/2013   Past Medical History:  Diagnosis Date  . Arthritis    OSTEO     No family history on file.  Past Surgical History:  Procedure Laterality Date  . KNEE ARTHROSCOPY Right 1998 or 1999  . LUMBAR LAMINECTOMY/DECOMPRESSION MICRODISCECTOMY  01/27/2012   Procedure: LUMBAR LAMINECTOMY/DECOMPRESSION MICRODISCECTOMY;  Surgeon: Barnett Abu, MD;  Location: [facility];  Service:  Neurosurgery;  Laterality: Left;  Left Lumbar five sacral one  Microdiskectomy  . TOTAL HIP ARTHROPLASTY Left 0715/2014   Dr Magnus IvanBlackman  . TOTAL HIP ARTHROPLASTY Left 02/12/2013   Procedure: LEFT TOTAL HIP ARTHROPLASTY ANTERIOR APPROACH;  Surgeon: Kathryne Hitchhristopher Y Blackman, MD;  Location: [facility] OR;  Service: Orthopedics;  Laterality: Left;   Social History   Occupational History  . Not on file.   Social History Main Topics  . Smoking status: Light Tobacco Smoker    Packs/day: 1.00    Years: 30.00    Types: Cigars  . Smokeless tobacco: Never Used     Comment: social  drinker  . Alcohol use Yes     Comment: 1-2 beers every couple of days  . Drug use: No  . Sexual activity: Not on file     Comment: Cigars once every few months

## 2016-09-05 ENCOUNTER — Other Ambulatory Visit (INDEPENDENT_AMBULATORY_CARE_PROVIDER_SITE_OTHER): Payer: Self-pay | Admitting: Physician Assistant

## 2016-09-07 ENCOUNTER — Other Ambulatory Visit (HOSPITAL_COMMUNITY): Payer: Self-pay | Admitting: *Deleted

## 2016-09-07 NOTE — Patient Instructions (Signed)
Victor SellsRobert L Snyder  09/07/2016   Your procedure is scheduled on: 09-16-16  [facility]  [facility]  [facility] to 3rd floor to  Short Stay Center at 800 AM.  Call this number if you have problems the morning of surgery [phone]   Remember: ONLY 1 PERSON MAY GO WITH YOU TO SHORT STAY TO GET  READY MORNING OF YOUR SURGERY.  Do not eat food or drink liquids :After Midnight.     Take these medicines the morning of surgery with A SIP OF WATER: ROSUVASTATIN (CRESTOR)                               You may not have any metal on your body including hair pins and              piercings  Do not wear jewelry, make-up, lotions, powders or perfumes, deodorant             Do not wear nail polish.  Do not shave  48 hours prior to surgery.              Men may shave face and neck.   Do not bring valuables to the hospital. [facility] IS NOT             RESPONSIBLE   FOR VALUABLES.  Contacts, dentures or bridgework may not be worn into surgery.  Leave suitcase in the car. After surgery it may be brought to your room.                 Please read over the following fact sheets you were given: _____________________________________________________________________             [facility] - Pre[facility] Before surgery, you can play an important role.  Because skin is not sterile, your skin needs to be as free of germs as possible.  You can reduce the number of germs on your skin by washing with CHG (chlorahexidine gluconate) soap before surgery.  CHG is an antiseptic cleaner which kills germs and bonds with the skin to continue killing germs even after washing. Please DO NOT use if you have an allergy to CHG or antibacterial soaps.  If your skin becomes reddened/irritated stop using the CHG and inform your nurse when you arrive at Short Stay. Do not shave (including legs and underarms) for at least 48 hours prior to the first CHG shower.  You may shave  your face/neck. Please follow these instructions carefully:  1.  Shower with CHG Soap the night before surgery and the  morning of Surgery.  2.  If you choose to wash your hair, wash your hair first as usual with your  normal  shampoo.  3.  After you shampoo, rinse your hair and body thoroughly to remove the  shampoo.                           4.  Use CHG as you would any other liquid soap.  You can apply chg directly  to the skin and wash                       Gently with a scrungie or clean washcloth.  5.  Apply the CHG Soap to  your body ONLY FROM THE NECK DOWN.   Do not use on face/ open                           Wound or open sores. Avoid contact with eyes, ears mouth and genitals (private parts).                       Wash face,  Genitals (private parts) with your normal soap.             6.  Wash thoroughly, paying special attention to the area where your surgery  will be performed.  7.  Thoroughly rinse your body with warm water from the neck down.  8.  DO NOT shower/wash with your normal soap after using and rinsing off  the CHG Soap.                9.  Pat yourself dry with a clean towel.            10.  Wear clean pajamas.            11.  Place clean sheets on your bed the night of your first shower and do not  sleep with pets. Day of Surgery : Do not apply any lotions/deodorants the morning of surgery.  Please wear clean clothes to the hospital/surgery center.  FAILURE TO FOLLOW THESE INSTRUCTIONS MAY RESULT IN THE CANCELLATION OF YOUR SURGERY PATIENT SIGNATURE_________________________________  NURSE SIGNATURE__________________________________  ________________________________________________________________________   Adam Phenix  An incentive spirometer is a tool that can help keep your lungs clear and active. This tool measures how well you are filling your lungs with each breath. Taking long deep breaths may help reverse or decrease the chance of developing  breathing (pulmonary) problems (especially infection) following:  A long period of time when you are unable to move or be active. BEFORE THE PROCEDURE   If the spirometer includes an indicator to show your best effort, your nurse or respiratory therapist will set it to a desired goal.  If possible, sit up straight or lean slightly forward. Try not to slouch.  Hold the incentive spirometer in an upright position. INSTRUCTIONS FOR USE  1. Sit on the edge of your bed if possible, or sit up as far as you can in bed or on a chair. 2. Hold the incentive spirometer in an upright position. 3. Breathe out normally. 4. Place the mouthpiece in your mouth and seal your lips tightly around it. 5. Breathe in slowly and as deeply as possible, raising the piston or the ball toward the top of the column. 6. Hold your breath for 3-5 seconds or for as long as possible. Allow the piston or ball to fall to the bottom of the column. 7. Remove the mouthpiece from your mouth and breathe out normally. 8. Rest for a few seconds and repeat Steps 1 through 7 at least 10 times every 1-2 hours when you are awake. Take your time and take a few normal breaths between deep breaths. 9. The spirometer may include an indicator to show your best effort. Use the indicator as a goal to work toward during each repetition. 10. After each set of 10 deep breaths, practice coughing to be sure your lungs are clear. If you have an incision (the cut made at the time of surgery), support your incision when coughing by placing a pillow or rolled up towels firmly against  it. Once you are able to get out of bed, walk around indoors and cough well. You may stop using the incentive spirometer when instructed by your caregiver.  RISKS AND COMPLICATIONS  Take your time so you do not get dizzy or light-headed.  If you are in pain, you may need to take or ask for pain medication before doing incentive spirometry. It is harder to take a deep  breath if you are having pain. AFTER USE  Rest and breathe slowly and easily.  It can be helpful to keep track of a log of your progress. Your caregiver can provide you with a simple table to help with this. If you are using the spirometer at home, follow these instructions: [place] IF:   You are having difficultly using the spirometer.  You have trouble using the spirometer as often as instructed.  Your pain medication is not giving enough relief while using the spirometer.  You develop fever of 100.5 F (38.1 C) or higher. SEEK IMMEDIATE MEDICAL CARE IF:   You cough up bloody sputum that had not been present before.  You develop fever of 102 F (38.9 C) or greater.  You develop worsening pain at or near the incision site. MAKE SURE YOU:   Understand these instructions.  Will watch your condition.  Will get help right away if you are not doing well or get worse. Document Released: 11/28/2006 Document Revised: 10/10/2011 Document Reviewed: 01/29/2007 Exit[employer] Patient Information 2014 Exit[employer], [place].   ________________________________________________________________________  WHAT IS A BLOOD TRANSFUSION? Blood Transfusion Information  A transfusion is the replacement of blood or some of its parts. Blood is made up of multiple cells which provide different functions.  Red blood cells carry oxygen and are used for blood loss replacement.  White blood cells fight against infection.  Platelets control bleeding.  Plasma helps clot blood.  Other blood products are available for specialized needs, such as hemophilia or other clotting disorders. BEFORE THE TRANSFUSION  Who gives blood for transfusions?   Healthy volunteers who are fully evaluated to make sure their blood is safe. This is blood bank blood. Transfusion therapy is the safest it has ever been in the practice of medicine. Before blood is taken from a donor, a complete history is taken to make sure  that person has no history of diseases nor engages in risky social behavior (examples are intravenous drug use or sexual activity with multiple partners). The donor's travel history is screened to minimize risk of transmitting infections, such as malaria. The donated blood is tested for signs of infectious diseases, such as HIV and hepatitis. The blood is then tested to be sure it is compatible with you in order to minimize the chance of a transfusion reaction. If you or a relative donates blood, this is often done in anticipation of surgery and is not appropriate for emergency situations. It takes many days to process the donated blood. RISKS AND COMPLICATIONS Although transfusion therapy is very safe and saves many lives, the main dangers of transfusion include:   Getting an infectious disease.  Developing a transfusion reaction. This is an allergic reaction to something in the blood you were given. Every precaution is taken to prevent this. The decision to have a blood transfusion has been considered carefully by your caregiver before blood is given. Blood is not given unless the benefits outweigh the risks. AFTER THE TRANSFUSION  Right after receiving a blood transfusion, you will usually feel much better and more  energetic. This is especially true if your red blood cells have gotten low (anemic). The transfusion raises the level of the red blood cells which carry oxygen, and this usually causes an energy increase.  The nurse administering the transfusion will monitor you carefully for complications. HOME CARE INSTRUCTIONS  No special instructions are needed after a transfusion. You may find your energy is better. Speak with your caregiver about any limitations on activity for underlying diseases you may have. SEEK MEDICAL CARE IF:   Your condition is not improving after your transfusion.  You develop redness or irritation at the intravenous (IV) site. SEEK IMMEDIATE MEDICAL CARE IF:  Any of  the following symptoms occur over the next 12 hours:  Shaking chills.  You have a temperature by mouth above 102 F (38.9 C), not controlled by medicine.  Chest, back, or muscle pain.  People around you feel you are not acting correctly or are confused.  Shortness of breath or difficulty breathing.  Dizziness and fainting.  You get a rash or develop hives.  You have a decrease in urine output.  Your urine turns a dark color or changes to pink, red, or brown. Any of the following symptoms occur over the next 10 days:  You have a temperature by mouth above 102 F (38.9 C), not controlled by medicine.  Shortness of breath.  Weakness after normal activity.  The white part of the eye turns yellow (jaundice).  You have a decrease in the amount of urine or are urinating less often.  Your urine turns a dark color or changes to pink, red, or brown. Document Released: 07/15/2000 Document Revised: 10/10/2011 Document Reviewed: 03/03/2008 [facility] Patient Information 2014 [place], [place].  _______________________________________________________________________

## 2016-09-09 ENCOUNTER — Encounter (HOSPITAL_COMMUNITY): Payer: Self-pay

## 2016-09-09 ENCOUNTER — Encounter (HOSPITAL_COMMUNITY)
Admission: RE | Admit: 2016-09-09 | Discharge: 2016-09-09 | Disposition: A | Discharge status: Home / Self Care | Payer: PRIVATE HEALTH INSURANCE | Source: Ambulatory Visit | Attending: Orthopaedic Surgery | Admitting: Orthopaedic Surgery

## 2016-09-09 ENCOUNTER — Encounter (INDEPENDENT_AMBULATORY_CARE_PROVIDER_SITE_OTHER): Payer: Self-pay

## 2016-09-09 DIAGNOSIS — Z01812 Encounter for preprocedural laboratory examination: Secondary | ICD-10-CM | POA: Insufficient documentation

## 2016-09-09 DIAGNOSIS — M1611 Unilateral primary osteoarthritis, right hip: Secondary | ICD-10-CM | POA: Insufficient documentation

## 2016-09-09 LAB — SURGICAL PCR SCREEN
MRSA, PCR: NEGATIVE
Staphylococcus aureus: POSITIVE — AB

## 2016-09-09 LAB — CBC
HCT: 39.8 % (ref 39.0–52.0)
Hemoglobin: 14.3 g/dL (ref 13.0–17.0)
MCH: 31 pg (ref 26.0–34.0)
MCHC: 35.9 g/dL (ref 30.0–36.0)
MCV: 86.1 fL (ref 78.0–100.0)
PLATELETS: 160 10*3/uL (ref 150–400)
RBC: 4.62 MIL/uL (ref 4.22–5.81)
RDW: 13.9 % (ref 11.5–15.5)
WBC: 3.9 10*3/uL — AB (ref 4.0–10.5)

## 2016-09-09 LAB — ABO/RH: ABO/RH(D): A POS

## 2016-09-16 ENCOUNTER — Encounter (HOSPITAL_COMMUNITY): Payer: Self-pay | Admitting: *Deleted

## 2016-09-16 ENCOUNTER — Inpatient Hospital Stay (HOSPITAL_COMMUNITY): Payer: PRIVATE HEALTH INSURANCE | Admitting: Anesthesiology

## 2016-09-16 ENCOUNTER — Inpatient Hospital Stay (HOSPITAL_COMMUNITY): Payer: PRIVATE HEALTH INSURANCE

## 2016-09-16 ENCOUNTER — Observation Stay (HOSPITAL_COMMUNITY): Payer: PRIVATE HEALTH INSURANCE

## 2016-09-16 ENCOUNTER — Observation Stay (HOSPITAL_COMMUNITY)
Admission: RE | Admit: 2016-09-16 | Discharge: 2016-09-17 | Disposition: A | Discharge status: Home / Self Care | Payer: PRIVATE HEALTH INSURANCE | Source: Ambulatory Visit | Attending: Orthopaedic Surgery | Admitting: Orthopaedic Surgery

## 2016-09-16 ENCOUNTER — Encounter (HOSPITAL_COMMUNITY)
Admission: RE | Disposition: A | Discharge status: Home / Self Care | Payer: Self-pay | Source: Ambulatory Visit | Attending: Orthopaedic Surgery

## 2016-09-16 DIAGNOSIS — F17290 Nicotine dependence, other tobacco product, uncomplicated: Secondary | ICD-10-CM | POA: Insufficient documentation

## 2016-09-16 DIAGNOSIS — Z96642 Presence of left artificial hip joint: Secondary | ICD-10-CM | POA: Diagnosis not present

## 2016-09-16 DIAGNOSIS — M25451 Effusion, right hip: Secondary | ICD-10-CM | POA: Insufficient documentation

## 2016-09-16 DIAGNOSIS — M25751 Osteophyte, right hip: Secondary | ICD-10-CM | POA: Diagnosis not present

## 2016-09-16 DIAGNOSIS — M1611 Unilateral primary osteoarthritis, right hip: Principal | ICD-10-CM | POA: Insufficient documentation

## 2016-09-16 DIAGNOSIS — R2681 Unsteadiness on feet: Secondary | ICD-10-CM

## 2016-09-16 DIAGNOSIS — M25551 Pain in right hip: Secondary | ICD-10-CM

## 2016-09-16 DIAGNOSIS — Z96641 Presence of right artificial hip joint: Secondary | ICD-10-CM

## 2016-09-16 HISTORY — PX: TOTAL HIP ARTHROPLASTY: SHX124

## 2016-09-16 LAB — TYPE AND SCREEN
ABO/RH(D): A POS
ANTIBODY SCREEN: NEGATIVE

## 2016-09-16 SURGERY — ARTHROPLASTY, HIP, TOTAL, ANTERIOR APPROACH
Anesthesia: General | Site: Hip | Laterality: Right

## 2016-09-16 MED ORDER — DEXAMETHASONE SODIUM PHOSPHATE 10 MG/ML IJ SOLN
INTRAMUSCULAR | Status: DC | PRN
  Administered 2016-09-16: 10 mg via INTRAVENOUS

## 2016-09-16 MED ORDER — ROCURONIUM BROMIDE 50 MG/5ML IV SOSY
PREFILLED_SYRINGE | INTRAVENOUS | Status: AC
  Filled 2016-09-16: qty 5

## 2016-09-16 MED ORDER — ONDANSETRON HCL 4 MG/2ML IJ SOLN
INTRAMUSCULAR | Status: AC
  Filled 2016-09-16: qty 2

## 2016-09-16 MED ORDER — PHENYLEPHRINE 40 MCG/ML (10ML) SYRINGE FOR IV PUSH (FOR BLOOD PRESSURE SUPPORT)
PREFILLED_SYRINGE | INTRAVENOUS | Status: AC
  Filled 2016-09-16: qty 10

## 2016-09-16 MED ORDER — METOCLOPRAMIDE HCL 5 MG PO TABS: 5.0000 mg | ORAL_TABLET | Freq: Three times a day (TID) | ORAL | Status: DC | PRN

## 2016-09-16 MED ORDER — DEXTROSE 5 % IV SOLN
500.0000 mg | Freq: Four times a day (QID) | INTRAVENOUS | Status: DC | PRN
  Administered 2016-09-16: 500 mg via INTRAVENOUS
  Filled 2016-09-16: qty 5
  Filled 2016-09-16: qty 550

## 2016-09-16 MED ORDER — ONDANSETRON HCL 4 MG/2ML IJ SOLN: 4.0000 mg | Freq: Four times a day (QID) | INTRAMUSCULAR | Status: DC | PRN

## 2016-09-16 MED ORDER — SUGAMMADEX SODIUM 200 MG/2ML IV SOLN
INTRAVENOUS | Status: DC | PRN
  Administered 2016-09-16: 200 mg via INTRAVENOUS

## 2016-09-16 MED ORDER — ASPIRIN 81 MG PO CHEW
81.0000 mg | CHEWABLE_TABLET | Freq: Two times a day (BID) | ORAL | Status: DC
  Administered 2016-09-16 – 2016-09-17 (×2): 81 mg via ORAL
  Filled 2016-09-16 (×2): qty 1

## 2016-09-16 MED ORDER — OXYCODONE HCL 5 MG PO TABS
5.0000 mg | ORAL_TABLET | ORAL | Status: DC | PRN
  Administered 2016-09-16: 5 mg via ORAL
  Filled 2016-09-16: qty 1

## 2016-09-16 MED ORDER — OXYCODONE-ACETAMINOPHEN 5-325 MG PO TABS: 1.0000 | ORAL_TABLET | ORAL | 0 refills | Status: DC | PRN

## 2016-09-16 MED ORDER — DIPHENHYDRAMINE HCL 12.5 MG/5ML PO ELIX: 12.5000 mg | ORAL_SOLUTION | ORAL | Status: DC | PRN

## 2016-09-16 MED ORDER — FENTANYL CITRATE (PF) 100 MCG/2ML IJ SOLN
INTRAMUSCULAR | Status: AC
  Filled 2016-09-16: qty 4

## 2016-09-16 MED ORDER — MIDAZOLAM HCL 2 MG/2ML IJ SOLN
INTRAMUSCULAR | Status: AC
Start: 2016-09-16 — End: 2016-09-16
  Filled 2016-09-16: qty 2

## 2016-09-16 MED ORDER — SUGAMMADEX SODIUM 200 MG/2ML IV SOLN
INTRAVENOUS | Status: AC
  Filled 2016-09-16: qty 2

## 2016-09-16 MED ORDER — LACTATED RINGERS IV SOLN
INTRAVENOUS | Status: DC
  Administered 2016-09-16 (×4): via INTRAVENOUS

## 2016-09-16 MED ORDER — SODIUM CHLORIDE 0.9 % IV SOLN: INTRAVENOUS | Status: DC

## 2016-09-16 MED ORDER — ROSUVASTATIN CALCIUM 10 MG PO TABS
10.0000 mg | ORAL_TABLET | Freq: Every day | ORAL | Status: DC
  Administered 2016-09-16 – 2016-09-17 (×2): 10 mg via ORAL
  Filled 2016-09-16 (×2): qty 1

## 2016-09-16 MED ORDER — DEXAMETHASONE SODIUM PHOSPHATE 10 MG/ML IJ SOLN
INTRAMUSCULAR | Status: AC
  Filled 2016-09-16: qty 1

## 2016-09-16 MED ORDER — SODIUM CHLORIDE 0.9 % IR SOLN
Status: DC | PRN
  Administered 2016-09-16: 1000 mL

## 2016-09-16 MED ORDER — CEFAZOLIN SODIUM-DEXTROSE 2-4 GM/100ML-% IV SOLN
2.0000 g | INTRAVENOUS | Status: AC
  Administered 2016-09-16: 2000 mg via INTRAVENOUS
  Filled 2016-09-16: qty 100

## 2016-09-16 MED ORDER — MENTHOL 3 MG MT LOZG: 1.0000 | LOZENGE | OROMUCOSAL | Status: DC | PRN

## 2016-09-16 MED ORDER — ZOLPIDEM TARTRATE 5 MG PO TABS: 5.0000 mg | ORAL_TABLET | Freq: Every evening | ORAL | Status: DC | PRN

## 2016-09-16 MED ORDER — ALUM & MAG HYDROXIDE-SIMETH 200-200-20 MG/5ML PO SUSP: 30.0000 mL | ORAL | Status: DC | PRN

## 2016-09-16 MED ORDER — FENTANYL CITRATE (PF) 100 MCG/2ML IJ SOLN
INTRAMUSCULAR | Status: AC
  Filled 2016-09-16: qty 2

## 2016-09-16 MED ORDER — ACETAMINOPHEN 325 MG PO TABS: 650.0000 mg | ORAL_TABLET | Freq: Four times a day (QID) | ORAL | Status: DC | PRN

## 2016-09-16 MED ORDER — LIDOCAINE 2% (20 MG/ML) 5 ML SYRINGE
INTRAMUSCULAR | Status: AC
  Filled 2016-09-16: qty 5

## 2016-09-16 MED ORDER — PROPOFOL 10 MG/ML IV BOLUS
INTRAVENOUS | Status: AC
  Filled 2016-09-16: qty 20

## 2016-09-16 MED ORDER — METOCLOPRAMIDE HCL 5 MG/ML IJ SOLN: 5.0000 mg | Freq: Three times a day (TID) | INTRAMUSCULAR | Status: DC | PRN

## 2016-09-16 MED ORDER — MIDAZOLAM HCL 2 MG/2ML IJ SOLN
INTRAMUSCULAR | Status: DC | PRN
  Administered 2016-09-16: 2 mg via INTRAVENOUS

## 2016-09-16 MED ORDER — CHLORHEXIDINE GLUCONATE 4 % EX LIQD: 60.0000 mL | Freq: Once | CUTANEOUS | Status: DC

## 2016-09-16 MED ORDER — TRANEXAMIC ACID 1000 MG/10ML IV SOLN
1000.0000 mg | INTRAVENOUS | Status: AC
  Administered 2016-09-16: 1000 mg via INTRAVENOUS
  Filled 2016-09-16: qty 1100

## 2016-09-16 MED ORDER — LIDOCAINE 2% (20 MG/ML) 5 ML SYRINGE
INTRAMUSCULAR | Status: DC | PRN
  Administered 2016-09-16: 100 mg via INTRAVENOUS

## 2016-09-16 MED ORDER — CEFAZOLIN IN D5W 1 GM/50ML IV SOLN
1.0000 g | Freq: Four times a day (QID) | INTRAVENOUS | Status: AC
  Administered 2016-09-16 – 2016-09-17 (×2): 1 g via INTRAVENOUS
  Filled 2016-09-16 (×2): qty 50

## 2016-09-16 MED ORDER — KETOROLAC TROMETHAMINE 15 MG/ML IJ SOLN
7.5000 mg | Freq: Four times a day (QID) | INTRAMUSCULAR | Status: AC
  Administered 2016-09-16 – 2016-09-17 (×4): 7.5 mg via INTRAVENOUS
  Filled 2016-09-16 (×4): qty 1

## 2016-09-16 MED ORDER — PHENYLEPHRINE HCL 10 MG/ML IJ SOLN
INTRAMUSCULAR | Status: AC
  Filled 2016-09-16: qty 1

## 2016-09-16 MED ORDER — ONDANSETRON HCL 4 MG/2ML IJ SOLN
INTRAMUSCULAR | Status: DC | PRN
Start: 2016-09-16 — End: 2016-09-16
  Administered 2016-09-16: 4 mg via INTRAVENOUS

## 2016-09-16 MED ORDER — DOCUSATE SODIUM 100 MG PO CAPS
100.0000 mg | ORAL_CAPSULE | Freq: Two times a day (BID) | ORAL | Status: DC
  Administered 2016-09-16 – 2016-09-17 (×2): 100 mg via ORAL
  Filled 2016-09-16 (×2): qty 1

## 2016-09-16 MED ORDER — FENTANYL CITRATE (PF) 100 MCG/2ML IJ SOLN
INTRAMUSCULAR | Status: DC | PRN
  Administered 2016-09-16 (×3): 100 ug via INTRAVENOUS

## 2016-09-16 MED ORDER — PROPOFOL 10 MG/ML IV BOLUS
INTRAVENOUS | Status: DC | PRN
  Administered 2016-09-16: 200 mg via INTRAVENOUS

## 2016-09-16 MED ORDER — ROCURONIUM BROMIDE 10 MG/ML (PF) SYRINGE
PREFILLED_SYRINGE | INTRAVENOUS | Status: DC | PRN
  Administered 2016-09-16: 50 mg via INTRAVENOUS

## 2016-09-16 MED ORDER — HYDROMORPHONE HCL 1 MG/ML IJ SOLN: 1.0000 mg | INTRAMUSCULAR | Status: DC | PRN

## 2016-09-16 MED ORDER — PHENOL 1.4 % MT LIQD: 1.0000 | OROMUCOSAL | Status: DC | PRN

## 2016-09-16 MED ORDER — ASPIRIN 81 MG PO CHEW: 81.0000 mg | CHEWABLE_TABLET | Freq: Two times a day (BID) | ORAL | 0 refills | Status: DC

## 2016-09-16 MED ORDER — METHOCARBAMOL 500 MG PO TABS
500.0000 mg | ORAL_TABLET | Freq: Four times a day (QID) | ORAL | Status: DC | PRN
  Administered 2016-09-17: 500 mg via ORAL
  Filled 2016-09-16: qty 1

## 2016-09-16 MED ORDER — CYCLOBENZAPRINE HCL 5 MG PO TABS: 5.0000 mg | ORAL_TABLET | Freq: Three times a day (TID) | ORAL | 0 refills | Status: DC | PRN

## 2016-09-16 MED ORDER — ACETAMINOPHEN 650 MG RE SUPP: 650.0000 mg | Freq: Four times a day (QID) | RECTAL | Status: DC | PRN

## 2016-09-16 MED ORDER — STERILE WATER FOR IRRIGATION IR SOLN
Status: DC | PRN
  Administered 2016-09-16: 2000 mL

## 2016-09-16 MED ORDER — ONDANSETRON HCL 4 MG PO TABS: 4.0000 mg | ORAL_TABLET | Freq: Four times a day (QID) | ORAL | Status: DC | PRN

## 2016-09-16 MED ORDER — CEFAZOLIN SODIUM-DEXTROSE 2-4 GM/100ML-% IV SOLN
INTRAVENOUS | Status: AC
  Filled 2016-09-16: qty 100

## 2016-09-16 MED ORDER — FENTANYL CITRATE (PF) 100 MCG/2ML IJ SOLN: 25.0000 ug | INTRAMUSCULAR | Status: DC | PRN

## 2016-09-16 SURGICAL SUPPLY — 36 items
BAG ZIPLOCK 12X15 (MISCELLANEOUS) IMPLANT
BENZOIN TINCTURE PRP APPL 2/3 (GAUZE/BANDAGES/DRESSINGS) ×2 IMPLANT
BLADE SAW SGTL 18X1.27X75 (BLADE) ×2 IMPLANT
CAPT HIP TOTAL 2 ×2 IMPLANT
CELLS DAT CNTRL 66122 CELL SVR (MISCELLANEOUS) ×1 IMPLANT
CLOTH BEACON ORANGE TIMEOUT ST (SAFETY) ×2 IMPLANT
COVER PERINEAL POST (MISCELLANEOUS) ×2 IMPLANT
CUP ACET PNNCL SECTR W/GRIP 56 (Hips) ×1 IMPLANT
DRAPE STERI IOBAN 125X83 (DRAPES) ×2 IMPLANT
DRAPE U-SHAPE 47X51 STRL (DRAPES) ×4 IMPLANT
DRSG AQUACEL AG ADV 3.5X10 (GAUZE/BANDAGES/DRESSINGS) ×2 IMPLANT
DURAPREP 26ML APPLICATOR (WOUND CARE) ×2 IMPLANT
ELECT REM PT RETURN 9FT ADLT (ELECTROSURGICAL) ×2
ELECTRODE REM PT RTRN 9FT ADLT (ELECTROSURGICAL) ×1 IMPLANT
GAUZE XEROFORM 1X8 LF (GAUZE/BANDAGES/DRESSINGS) IMPLANT
GLOVE BIO SURGEON STRL SZ7.5 (GLOVE) ×2 IMPLANT
GLOVE BIOGEL PI IND STRL 8 (GLOVE) ×2 IMPLANT
GLOVE BIOGEL PI INDICATOR 8 (GLOVE) ×2
GLOVE ECLIPSE 8.0 STRL XLNG CF (GLOVE) ×2 IMPLANT
GOWN STRL REUS W/TWL XL LVL3 (GOWN DISPOSABLE) ×4 IMPLANT
HANDPIECE INTERPULSE COAX TIP (DISPOSABLE) ×1
HOLDER FOLEY CATH W/STRAP (MISCELLANEOUS) ×2 IMPLANT
PACK ANTERIOR HIP CUSTOM (KITS) ×2 IMPLANT
PINN SECTOR W/GRIP ACE CUP 56 (Hips) ×2 IMPLANT
RTRCTR WOUND ALEXIS 18CM MED (MISCELLANEOUS) ×2
SET HNDPC FAN SPRY TIP SCT (DISPOSABLE) ×1 IMPLANT
STAPLER VISISTAT 35W (STAPLE) IMPLANT
STRIP CLOSURE SKIN 1/2X4 (GAUZE/BANDAGES/DRESSINGS) ×2 IMPLANT
SUT ETHIBOND NAB CT1 #1 30IN (SUTURE) ×2 IMPLANT
SUT MNCRL AB 4-0 PS2 18 (SUTURE) IMPLANT
SUT VIC AB 0 CT1 36 (SUTURE) ×2 IMPLANT
SUT VIC AB 1 CT1 36 (SUTURE) ×2 IMPLANT
SUT VIC AB 2-0 CT1 27 (SUTURE) ×2
SUT VIC AB 2-0 CT1 TAPERPNT 27 (SUTURE) ×2 IMPLANT
TRAY FOLEY W/METER SILVER 16FR (SET/KITS/TRAYS/PACK) ×2 IMPLANT
YANKAUER SUCT BULB TIP 10FT TU (MISCELLANEOUS) ×2 IMPLANT

## 2016-09-16 NOTE — Brief Op Note (Signed)
09/16/2016  11:30 AM  PATIENT:  Victor Snyder  61 y.o. male  PRE-OPERATIVE DIAGNOSIS:  osteoarthritis right hip  POST-OPERATIVE DIAGNOSIS:  osteoarthritis right hip  PROCEDURE:  Procedure(s): RIGHT TOTAL HIP ARTHROPLASTY ANTERIOR APPROACH (Right)  SURGEON:  Surgeon(s) and Role:    * Kathryne Hitchhristopher Y Keili Hasten, MD - Primary  PHYSICIAN ASSISTANT: Rexene EdisonGil Clark, PA-C  ANESTHESIA:   general  EBL:  Total I/O In: 1000 [I.V.:1000] Out: 300 [Blood:300]  COUNTS:  YES  DICTATION: .Other Dictation: Dictation Number [id number]  PLAN OF CARE: Admit to inpatient   PATIENT DISPOSITION:  PACU - hemodynamically stable.   Delay start of Pharmacological VTE agent (>24hrs) due to surgical blood loss or risk of bleeding: no

## 2016-09-16 NOTE — Anesthesia Postprocedure Evaluation (Signed)
Anesthesia Post Note  Patient: Victor Snyder  Procedure(s) Performed: Procedure(s) (LRB): RIGHT TOTAL HIP ARTHROPLASTY ANTERIOR APPROACH (Right)  Anesthesia Type: General       Last Vitals:  Vitals:   09/16/16 1451 09/16/16 1551  BP: 123/90 129/73  Pulse: (!) 53 (!) 48  Resp: 17 16  Temp: 36.7 C 36.7 C    Last Pain:  Vitals:   09/16/16 1551  TempSrc: Axillary  PainSc:                  [name],[name]

## 2016-09-16 NOTE — Discharge Instructions (Signed)

## 2016-09-16 NOTE — Anesthesia Preprocedure Evaluation (Addendum)
Anesthesia Evaluation  Patient identified by MRN, date of birth, ID band Patient awake    Reviewed: Allergy & Precautions, H&P , Patient's Chart, lab work & pertinent test results, reviewed documented beta blocker date and time   Airway Mallampati: II  TM Distance: >3 FB Neck ROM: full    Dental no notable dental hx. (+) Dental Advisory Given   Pulmonary Current Smoker,    Pulmonary exam normal breath sounds clear to auscultation       Cardiovascular  Rhythm:regular Rate:Normal     Neuro/Psych    GI/Hepatic   Endo/Other    Renal/GU      Musculoskeletal   Abdominal   Peds  Hematology   Anesthesia Other Findings   Reproductive/Obstetrics                            Anesthesia Physical Anesthesia Plan  ASA: II  Anesthesia Plan: General   Post-op Pain Management:    Induction: Intravenous  Airway Management Planned: Oral ETT  Additional Equipment:   Intra-op Plan:   Post-operative Plan: Extubation in OR  Informed Consent: I have reviewed the patients History and Physical, chart, labs and discussed the procedure including the risks, benefits and alternatives for the proposed anesthesia with the patient or authorized representative who has indicated his/her understanding and acceptance.   Dental Advisory Given and Dental advisory given  Plan Discussed with: CRNA and Surgeon  Anesthesia Plan Comments: (  Discussed general anesthesia, including possible nausea, instrumentation of airway, sore throat,pulmonary aspiration, etc. I asked if the were any outstanding questions, or  concerns before we proceeded.)        Anesthesia Quick Evaluation

## 2016-09-16 NOTE — Transfer of Care (Signed)
Immediate Anesthesia Transfer of Care Note  Patient: Victor Snyder  Procedure(s) Performed: Procedure(s): RIGHT TOTAL HIP ARTHROPLASTY ANTERIOR APPROACH (Right)  Patient Location: PACU  Anesthesia Type:General  Level of Consciousness: awake, oriented and patient cooperative  Airway & Oxygen Therapy: Patient Spontanous Breathing and Patient connected to face mask oxygen  Post-op Assessment: Report given to RN and Post -op Vital signs reviewed and stable  Post vital signs: Reviewed and stable  Last Vitals:  Vitals:   09/16/16 0733 09/16/16 1152  BP: (!) 152/82   Pulse: (!) 54   Resp: 18   Temp: 36.6 C 36.9 C    Last Pain:  Vitals:   09/16/16 0733  TempSrc: Oral         Complications: No apparent anesthesia complications

## 2016-09-16 NOTE — Anesthesia Procedure Notes (Signed)
Procedure Name: Intubation Date/Time: 09/16/2016 10:17 AM Performed by: Minerva EndsMIRARCHI, Shadai Mcclane M Pre-anesthesia Checklist: Patient identified, Emergency Drugs available, Suction available and Patient being monitored Patient Re-evaluated:Patient Re-evaluated prior to inductionOxygen Delivery Method: Circle System Utilized Preoxygenation: Pre-oxygenation with 100% oxygen Intubation Type: IV induction Ventilation: Mask ventilation without difficulty Laryngoscope Size: Miller and 2 Grade View: Grade I Tube type: Oral Tube size: 7.5 mm Number of attempts: 1 Airway Equipment and Method: Stylet and Oral airway (OA placed by Jean RosenthalJackson) Placement Confirmation: ETT inserted through vocal cords under direct vision,  positive ETCO2 and breath sounds checked- equal and bilateral Secured at: 23 cm Tube secured with: Tape Dental Injury: Teeth and Oropharynx as per pre-operative assessment  Comments: Smooth IV induction Jean RosenthalJackson-- intubation AM CRNA atraumatic-- chipped front teeth present prior to laryngoscopy-- teeth and mouth as preop --- bilat BS Jean RosenthalJackson

## 2016-09-16 NOTE — H&P (Signed)
TOTAL HIP ADMISSION H&P  Patient is admitted for right total hip arthroplasty.  Subjective:  Chief Complaint: right hip pain  HPI: Victor Snyder, 61 y.o. male, has a history of pain and functional disability in the right hip(s) due to arthritis and patient has failed non-surgical conservative treatments for greater than 12 weeks to include NSAID's and/or analgesics, corticosteriod injections, flexibility and strengthening excercises, use of assistive devices, weight reduction as appropriate and activity modification.  Onset of symptoms was gradual starting 3 years ago with gradually worsening course since that time.The patient noted no past surgery on the right hip(s).  Patient currently rates pain in the right hip at 10 out of 10 with activity. Patient has night pain, worsening of pain with activity and weight bearing, pain that interfers with activities of daily living, pain with passive range of motion and crepitus. Patient has evidence of subchondral cysts, subchondral sclerosis, periarticular osteophytes and joint space narrowing by imaging studies. This condition presents safety issues increasing the risk of falls.  There is no current active infection.  Patient Active Problem List   Diagnosis Date Noted  . Unilateral primary osteoarthritis, right hip 09/16/2016  . Degenerative arthritis of hip 02/12/2013   Past Medical History:  Diagnosis Date  . Arthritis    OSTEO     Past Surgical History:  Procedure Laterality Date  . colonscopy  10 yrs ago  . KNEE ARTHROSCOPY Right 1998 or 1999  . LUMBAR LAMINECTOMY/DECOMPRESSION MICRODISCECTOMY  01/27/2012   Procedure: LUMBAR LAMINECTOMY/DECOMPRESSION MICRODISCECTOMY;  Surgeon: Barnett Abu, MD;  Location: [facility];  Service: Neurosurgery;  Laterality: Left;  Left Lumbar five sacral one  Microdiskectomy  . TOTAL HIP ARTHROPLASTY Left 0715/2014   Dr Magnus Ivan  . TOTAL HIP ARTHROPLASTY Left 02/12/2013   Procedure: LEFT TOTAL HIP ARTHROPLASTY  ANTERIOR APPROACH;  Surgeon: Kathryne Hitch, MD;  Location: [facility];  Service: Orthopedics;  Laterality: Left;    No prescriptions prior to admission.   No Known Allergies  Social History  Substance Use Topics  . Smoking status: Light Tobacco Smoker    Packs/day: 1.00    Years: 30.00    Types: Cigars  . Smokeless tobacco: Never Used     Comment: social drinker  . Alcohol use Yes     Comment: 1-2 beers every couple of days    No family history on file.   Review of Systems  Musculoskeletal: Positive for joint pain.  All other systems reviewed and are negative.   Objective:  Physical Exam  Constitutional: He is oriented to person, place, and time. He appears well-developed and well-nourished.  HENT:  Head: Normocephalic and atraumatic.  Eyes: EOM are normal. Pupils are equal, round, and reactive to light.  Neck: Normal range of motion. Neck supple.  Cardiovascular: Normal rate and regular rhythm.   Respiratory: Effort normal and breath sounds normal.  GI: Soft. Bowel sounds are normal.  Musculoskeletal:       Right hip: He exhibits decreased range of motion, decreased strength, tenderness and bony tenderness.  Neurological: He is alert and oriented to person, place, and time.  Skin: Skin is warm and dry.  Psychiatric: He has a normal mood and affect.    Vital signs in last 24 hours:    Labs:   Estimated body mass index is 31.22 kg/m as calculated from the following:   Height as of 09/09/16: 6\' 1"  (1.854 m).   Weight as of 09/09/16: 236 lb 9.6 oz (107.3 kg).  Imaging Review Plain radiographs demonstrate severe degenerative joint disease of the right hip(s). The bone quality appears to be excellent for age and reported activity level.  Assessment/Plan:  End stage arthritis, right hip(s)  The patient history, physical examination, clinical judgement of the provider and imaging studies are consistent with end stage degenerative joint disease of the right hip(s)  and total hip arthroplasty is deemed medically necessary. The treatment options including medical management, injection therapy, arthroscopy and arthroplasty were discussed at length. The risks and benefits of total hip arthroplasty were presented and reviewed. The risks due to aseptic loosening, infection, stiffness, dislocation/subluxation,  thromboembolic complications and other imponderables were discussed.  The patient acknowledged the explanation, agreed to proceed with the plan and consent was signed. Patient is being admitted for inpatient treatment for surgery, pain control, PT, OT, prophylactic antibiotics, VTE prophylaxis, progressive ambulation and ADL's and discharge planning.The patient is planning to be discharged home with home health services

## 2016-09-16 NOTE — Evaluation (Addendum)
Physical Therapy Evaluation Patient Details Name: Victor Snyder MRN: [id number] DOB: January 16, 1956 Today's Date: 09/16/2016   History of Present Illness  admit s/p R THA anteriro approach , with LTHA in 2014.   Clinical Impression  Pt is s/p THA direct anterior  resulting in the deficits listed below (see PT Problem List).  Pt will benefit from acute PT to increase their independence and safety with mobility to allow discharge home with wife .      Follow Up Recommendations No PT follow up (depending on pt progress)    Equipment Recommendations  None recommended by PT    Recommendations for Other Services       Precautions / Restrictions Precautions Precautions: None Restrictions Weight Bearing Restrictions: No      Mobility  Bed Mobility Overal bed mobility: Modified Independent             General bed mobility comments: impulsive. Got himself to EOB before I could even move side rail or disconnect foot pumps etc.   Transfers Overall transfer level: Needs assistance Equipment used: Rolling walker (2 wheeled) Transfers: Sit to/from Stand Sit to Stand: Supervision         General transfer comment: educated with safety for RW , pt wanted to be very independent (refused gait belt)   Ambulation/Gait Ambulation/Gait assistance: Min guard Ambulation Distance (Feet): 60 Feet Assistive device: Rolling walker (2 wheeled) Gait Pattern/deviations: Step-to pattern Gait velocity: fast, educated to slow downa dn not get too close to front of RW    General Gait Details: limited WB on R LE due to provoking great pain in lateral distal thigh area as discribed above. So at times swung thru phase with no WBing on R LE.   Stairs            Wheelchair Mobility    Modified Rankin (Stroke Patients Only)       Balance                                             Pertinent Vitals/Pain Pain Assessment: 0-10 Pain Score: 1  Pain Location: denied  pain before we started . he did have GREAT pain 8/10 when trying to weight bear and it was lateral distal thigh area around IT band area. Stabbing and burning with weihgt bearing and trying to walk.  Pain Descriptors / Indicators: Burning;Stabbing Pain Intervention(s): Monitored during session;Repositioned (once supine in bed again pain in lateral leg subsided . Was provoked slightly with hip abd (muscscle activity triggered) )    Home Living Family/patient expects to be discharged to:: Private residence Living Arrangements: Spouse/significant other;Children Available Help at Discharge: Family;Available 24 hours/day Type of Home: House Home Access: Stairs to enter Entrance Stairs-Rails: Right;Can reach both Entrance Stairs-Number of Steps: 4-5 Home Layout: Multi-level;Able to live on main level with bedroom/bathroom Home Equipment: Dan Humphreys - 2 wheels      Prior Function Level of Independence: Independent               Hand Dominance        Extremity/Trunk Assessment        Lower Extremity Assessment Lower Extremity Assessment: Overall WFL for tasks assessed       Communication   Communication: No difficulties  Cognition Arousal/Alertness: Awake/alert Behavior During Therapy: WFL for tasks assessed/performed Overall Cognitive Status: Within Functional Limits for tasks assessed  General Comments      Exercises Total Joint Exercises Ankle Circles/Pumps: AROM;Right;Supine Heel Slides: AROM;Right;5 reps;Supine Hip ABduction/ADduction: AROM;Right;5 reps;Supine   Assessment/Plan    PT Assessment Patient needs continued PT services  PT Problem List Decreased strength;Decreased activity tolerance;Decreased mobility          PT Treatment Interventions Gait training;Stair training;DME instruction;Functional mobility training;Therapeutic activities;Therapeutic exercise;Patient/family education    PT Goals (Current goals can be found in  the Care Plan section)  Acute Rehab PT Goals Patient Stated Goal: get home tomorrow PT Goal Formulation: With patient Time For Goal Achievement: 09/30/16 Potential to Achieve Goals: Good    Frequency 7X/week   Barriers to discharge        Co-evaluation               End of Session   Activity Tolerance: Patient tolerated treatment well Patient left: in bed;with call bell/phone within reach;with SCD's reapplied (nursing was going in to do meds nad set bed alarm) Nurse Communication: Mobility status         Time: 1740-1810 PT Time Calculation (min) (ACUTE ONLY): 30 min   Charges:   PT Evaluation $PT Eval Low Complexity: 1 Procedure PT Treatments $Gait Training: 8-22 mins   PT G CodesMarella Bile:        Tigerlily Christine 09/16/2016, 6:51 PM  Marella BileSharron Thelmer Legler, PT Pager: [phone] 09/16/2016

## 2016-09-17 DIAGNOSIS — M1611 Unilateral primary osteoarthritis, right hip: Secondary | ICD-10-CM | POA: Diagnosis not present

## 2016-09-17 LAB — CBC
HEMATOCRIT: 31.4 % — AB (ref 39.0–52.0)
HEMOGLOBIN: 11.5 g/dL — AB (ref 13.0–17.0)
MCH: 31.1 pg (ref 26.0–34.0)
MCHC: 36.6 g/dL — AB (ref 30.0–36.0)
MCV: 84.9 fL (ref 78.0–100.0)
Platelets: 152 10*3/uL (ref 150–400)
RBC: 3.7 MIL/uL — AB (ref 4.22–5.81)
RDW: 13.2 % (ref 11.5–15.5)
WBC: 8.5 10*3/uL (ref 4.0–10.5)

## 2016-09-17 LAB — BASIC METABOLIC PANEL
Anion gap: 5 (ref 5–15)
BUN: 14 mg/dL (ref 6–20)
CHLORIDE: 107 mmol/L (ref 101–111)
CO2: 26 mmol/L (ref 22–32)
CREATININE: 0.77 mg/dL (ref 0.61–1.24)
Calcium: 8.3 mg/dL — ABNORMAL LOW (ref 8.9–10.3)
GFR calc non Af Amer: >60 mL/min (ref >60)
Glucose, Bld: 131 mg/dL — ABNORMAL HIGH (ref 65–99)
POTASSIUM: 4.3 mmol/L (ref 3.5–5.1)
Sodium: 138 mmol/L (ref 135–145)

## 2016-09-17 NOTE — Op Note (Signed)
Victor Snyder, [name]:  [id number]  MEDICAL RECORD NO.:  [id number]  LOCATION:                                 FACILITY:  PHYSICIAN:  Vanita Panda. Magnus Ivan, M.D.DATE OF BIRTH:  11/12/1955  DATE OF PROCEDURE:  09/16/2016 DATE OF DISCHARGE:                              OPERATIVE REPORT   POSTOPERATIVE DIAGNOSIS:  Primary osteoarthritis and degenerative joint disease, right hip.  POSTOPERATIVE DIAGNOSIS:  Primary osteoarthritis and degenerative joint disease, right hip.  PROCEDURE:  Right total hip arthroplasty through direct anterior approach.  IMPLANTS:  DePuy Sector Gription acetabular component size 58, size 36+ 4 neutral polyethylene liner for that size acetabular component, size 12 Corail femoral component with standard offset, size 36+ 5 ceramic hip ball.  ASSISTANT:  [name], PA-C.  ANESTHESIA:  General.  ANTIBIOTICS:  2 g IV Ancef.  BLOOD LOSS:  300 mL.  COMPLICATIONS:  None.  INDICATIONS:  Mr. Victor Snyder is a well-known 61 year old patient of mine, who has debilitating arthritis involving his right hip.  He had this on his left hip; in 2014, underwent a successful left total hip arthroplasty.  At this point, he wished to have this right hip replaced. His pain is daily, and it is detrimentally affecting his activities of daily living, his quality of life, and mobility.  His x-rays confirm end- stage arthritis of that hip as well.  He has tried and failed all forms of conservative treatment.  He understands the risk of acute blood loss anemia, nerve and vessel injury, fracture, infection, dislocation, DVT. He understands our goals are decreased pain, improved mobility, and overall improved quality of life.  PROCEDURE DESCRIPTION:  After informed consent was obtained, appropriate right hip was marked.  He was brought to the operating room.  General anesthesia was obtained while he was on a stretcher.  We did not place a Foley  catheter.  Traction boots were placed on both his feet.  Next, he was placed supine on the Hana fracture table with the perineal post in place and both legs in inline skeletal traction devices, but no traction applied.  His right operative hip was then prepped and draped with DuraPrep and sterile drapes.  Time-out was called and he was identified as correct patient, correct right hip.  I then made an anterior incision just inferior and posterior to the anterior superior iliac spine and carried this obliquely down the leg.  We dissected down the tensor fascia lata muscle.  The tensor fascia was then divided longitudinally to proceed with a direct anterior approach to the hip.  We cauterized and identified the circumflex vessels, and identified the hip capsule. I opened up the hip capsule finding a moderate joint effusion and significant periarticular osteophytes.  We then placed Cobra retractors within the joint capsule and around the medial and lateral femoral neck. We made our femoral neck cut with an oscillating saw just proximal to the lesser trochanter and completed this with an osteotome.  We placed a corkscrew guide in the femoral head and removed the femoral head in its entirety and found to be completely devoid of cartilage.  We then cleaned the remnants of the acetabular  labrum and other debris from the labrum and the acetabulum.  We placed a bent Hohmann over the medial acetabular rim and then began reaming under direct visualization from a size 43 reamer up to a size 56 with all reamers under direct visualization, the last reamer under direct fluoroscopy, so we could obtain our depth of reaming, our inclination, and anteversion.  I then placed the real DePuy Sector Gription acetabular component size 56, but I did not get a good purchase of that.  Based on what I was seeing, I felt that I did not need to ream out, but I need to go 1 size higher on acetabular component.  I easily  removed the 56 acetabular component and then I put the 58 in, in appropriate anteversion and inclination and I was pleased with and verified this under fluoroscopy and had a good feel to it, and we did not need to even place a screw, it was a nice tight fit.  We then placed the real 36+ 4 neutral polyethylene liner for a size 58 acetabular component.  Attention was then turned to the femur with the leg externally rotated to 125 degrees, extended and adducted. We were able place a Muller retractor medially and a Hohmann retractor behind the greater trochanter.  We released lateral joint capsule and used a box cutting osteotome to enter the femoral canal and a rongeur to lateralize.  We then began broaching from a size 8 broach using the Corail broaching system going up to a size 12.  With a size 12, we trialed a standard offset femoral neck and a 36+ 5 hip ball.  We went with the +5 just due to the fact that we had medialized him more.  We reduced this in the acetabulum.  We were pleased with leg length, offset, and stability.  We then dislocated the hip, removed the trial components.  We were able to place the real Corail femoral component with standard offset, size 12, and the real 36+ 5 hip ball, reduced this in the acetabulum, and again, we were pleased with stability.  We then irrigated the soft tissue with normal saline solution using pulsatile lavage.  We closed the joint capsule with interrupted #1 Ethibond suture, followed by running #1 Vicryl in the tensor fascia, 0 Vicryl in the deep tissue, 2-0 Vicryl in the subcutaneous tissue, 4-0 Monocryl subcuticular stitch, and Steri-Strips on the skin.  An Aquacel dressing was applied.  He was then taken off the Hana table, awakened, extubated, and taken to recovery room in stable condition.  All final counts were correct.  There were no complications noted.  Of note, Gilbert Clark, PA- C assisted in the entire case. [name] His assistance was  crucial for facilitating all aspects of this case.     Vanita Pandahristopher Y. Magnus IvanBlackman, M.D.   ______________________________ Vanita Pandahristopher Y. Magnus IvanBlackman, M.D.    CYB/MEDQ  D:  09/16/2016  T:  09/17/2016  Job:  [id number]

## 2016-09-17 NOTE — Evaluation (Signed)
Occupational Therapy One Time Evaluation Patient Details Name: Victor Snyder MRN: [id number] DOB: 12/29/1955 Today's Date: 09/17/2016    History of Present Illness admit s/p R THA anterior approach , with LTHA in 2014.    Clinical Impression   Pt doing well and supposed to be d/c today. Educated pt on need to go a little slower with tasks for safety. He attempted to stand up quickly from chair despite therapist telling him to wait to make sure chair locked and secure. He unlocked chair again at end of session but educated pt to keep chair locked so when he stands up, the chair doesn't roll out from under him. Pt did lock chair after recommendation. Family present for session. He has all DME. No further OT needs.    Follow Up Recommendations  No OT follow up;Supervision - Intermittent    Equipment Recommendations  None recommended by OT    Recommendations for Other Services       Precautions / Restrictions Precautions Precautions: None Restrictions Weight Bearing Restrictions: No      Mobility Bed Mobility              General bed mobility comments: in chair.   Transfers Overall transfer level: Needs assistance Equipment used: Rolling walker (2 wheeled) Transfers: Sit to/from Stand Sit to Stand: Supervision         General transfer comment: cues to lock chair before attempting to stand as pt tends to like to keep chair unlocked.    Balance                                            ADL Overall ADL's : Needs assistance/impaired Eating/Feeding: Independent;Sitting   Grooming: Wash/dry face;Set up;Sitting   Upper Body Bathing: Set up;Sitting   Lower Body Bathing: Sit to/from stand;Supervison/ safety   Upper Body Dressing : Set up;Sitting   Lower Body Dressing: Sit to/from stand;Supervision/safety   Toilet Transfer: Supervision/safety;Ambulation;Comfort height toilet;RW   Toileting- Clothing Manipulation and Hygiene:  Supervision/safety;Sit to/from stand         General ADL Comments: Pt tends to be impulsive at times as he attempted to pick up his walker and carry it as he stepped through tight space at the end of the bed to go into the bathroom. Educated pt on the importance of keeping the walker down on the floor at all times. Pt able to transfer on and off commode with only holding to the center of the commode for support. He is also able to reach down and don pants over his feet without difficulty. Educated on sequence for LB dressing to make task easier but pt able to don clothing over L LE first and still don over right without difficulty. Educated pt on need to slow down with tasks for safety and to progress activity gradually. Educated on sequence for stepping in and out of the tub. Pt states he is alittle stiff currently and plans to sponge bathe a couple of days but verbalized understanding of demonstration of how to step in and out of tub.     Vision     Perception     Praxis      Pertinent Vitals/Pain Pain Assessment: 0-10 Pain Score: 3  Pain Location: R hip/thigh Pain Descriptors / Indicators: Discomfort;Operative site guarding;Tender Pain Intervention(s): Monitored during session;Repositioned;Ice applied     Hand Dominance  Extremity/Trunk Assessment Upper Extremity Assessment Upper Extremity Assessment: Overall WFL for tasks assessed           Communication Communication Communication: No difficulties   Cognition Arousal/Alertness: Awake/alert Behavior During Therapy: WFL for tasks assessed/performed Overall Cognitive Status: Within Functional Limits for tasks assessed                     General Comments       Exercises       Shoulder Instructions      Home Living Family/patient expects to be discharged to:: Private residence Living Arrangements: Spouse/significant other;Children Available Help at Discharge: Family;Available 24 hours/day Type of Home:  House Home Access: Stairs to enter [employer] of Steps: 4-5 Entrance Stairs-Rails: Right;Can reach both Home Layout: Multi-level;Able to live on main level with bedroom/bathroom Alternate Level Stairs-Number of Steps: 15 (can live ont eh main level , but pt stated he will make it up the stairs no matter what. ) Alternate Level Stairs-Rails: Can reach both Bathroom Shower/Tub: Tub/shower unit;Curtain   Bathroom Toilet: Standard     Home Equipment: Environmental consultant - 2 wheels;Bedside commode          Prior Functioning/Environment Level of Independence: Independent                 OT Problem List:     OT Treatment/Interventions:      OT Goals(Current goals can be found in the care plan section) Acute Rehab OT Goals Patient Stated Goal: home OT Goal Formulation: With patient  OT Frequency:     Barriers to D/C:            Co-evaluation              End of Session Equipment Utilized During Treatment: Rolling walker  Activity Tolerance:   Patient left: in chair;with call bell/phone within reach;with family/visitor present   Time: 1207-1229 OT Time Calculation (min): 22 min Charges:  OT General Charges $OT Visit: 1 Procedure OT Evaluation $OT Eval Low Complexity: 1 Procedure G-Codes: OT G-codes **NOT FOR INPATIENT CLASS** Functional Assessment Tool Used: clinical judgement Functional Limitation: Self care Self Care Current Status (W0981): At least 1 percent but less than 20 percent impaired, limited or restricted Self Care Goal Status (X9147): At least 1 percent but less than 20 percent impaired, limited or restricted  Lennox Laity 09/17/2016, 1:16 PM

## 2016-09-17 NOTE — Progress Notes (Signed)
Physical Therapy Treatment Patient Details Name: Victor Snyder MRN: [id number] DOB: 12-23-1955 Today's Date: 09/17/2016    History of Present Illness admit s/p R THA anteriro approach , with LTHA in 2014.     PT Comments    POD # 1 Pt has met max mobility level Pt ready for D/C to home  Follow Up Recommendations  No PT follow up     Equipment Recommendations       Recommendations for Other Services       Precautions / Restrictions Precautions Precautions: None    Mobility  Bed Mobility Overal bed mobility: Modified Independent             General bed mobility comments: impulsive.  Transfers Overall transfer level: Modified independent Equipment used: Rolling walker (2 wheeled) Transfers: Sit to/from [employer] transfer comment: good safety cognition  Ambulation/Gait Ambulation/Gait assistance: Modified independent (Device/Increase time) Ambulation Distance (Feet): 285 Feet Assistive device: Rolling walker (2 wheeled) Gait Pattern/deviations: Step-through pattern Gait velocity: WFL   General Gait Details: good alternating gait   Stairs Stairs: Yes   Stair Management: One rail Right;Step to pattern;Forwards Number of Stairs: 5 General stair comments: performed well      pt educated from previous   Wheelchair Mobility    Modified Rankin (Stroke Patients Only)       Balance                                    Cognition Arousal/Alertness: Awake/alert Behavior During Therapy: WFL for tasks assessed/performed Overall Cognitive Status: Within Functional Limits for tasks assessed                      Exercises   Total Hip Replacement TE's 10 reps ankle pumps 10 reps knee presses 10 reps heel slides 10 reps SAQ's 10 reps ABD 10 reps standing Given handout HEP Followed by ICE     General Comments        Pertinent Vitals/Pain Pain Assessment: 0-10 Pain Score: 3  Pain  Location: R hip/thigh Pain Descriptors / Indicators: Discomfort;Operative site guarding;Tender Pain Intervention(s): Monitored during session;Repositioned;Ice applied    Home Living                      Prior Function            PT Goals (current goals can now be found in the care plan section) Progress towards PT goals: Progressing toward goals    Frequency    7X/week      PT Plan Current plan remains appropriate    Co-evaluation             End of Session Equipment Utilized During Treatment: Gait belt Activity Tolerance: Patient tolerated treatment well Patient left: in bed     Time: 1000-1025 PT Time Calculation (min) (ACUTE ONLY): 25 min  Charges:  $Gait Training: 8-22 mins $Therapeutic Activity: 8-22 mins                    G Codes:      Rica Koyanagi  PTA [facility]  Acute  Rehab Pager      [phone]

## 2016-09-17 NOTE — Progress Notes (Signed)
Subjective: Patient doing well.  He has walked in the room.  He's ready for discharge he is also walked in the hall.   Objective: Vital signs in last 24 hours: Temp:  [98 F (36.7 C)-98.9 F (37.2 C)] 98.9 F (37.2 C) (02/17 1022) Pulse Rate:  [48-69] 69 (02/17 1022) Resp:  [16-20] 20 (02/17 1022) BP: (113-139)/(60-90) 137/78 (02/17 1022) SpO2:  [93 %-100 %] 99 % (02/17 1022)  Intake/Output from previous day: 02/16 0701 - 02/17 0700 In: 4776.3 [P.O.:1080; I.V.:3431.3; IV Piggyback:265] Out: 4250 [Urine:3950; Blood:300] Intake/Output this shift: Total I/O In: 1185 [P.O.:960; I.V.:225] Out: -   Exam:  Sensation intact distally Intact pulses distally  Labs:  Recent Labs  09/17/16 0430  HGB 11.5*    Recent Labs  09/17/16 0430  WBC 8.5  RBC 3.70*  HCT 31.4*  PLT 152    Recent Labs  09/17/16 0430  NA 138  K 4.3  CL 107  CO2 26  BUN 14  CREATININE 0.77  GLUCOSE 131*  CALCIUM 8.3*   No results for input(s): LABPT, INR in the last 72 hours.  Assessment/Plan: Plan for discharge today per patient request.  Medically he stable   G Dorene GrebeScott Yavier Snider 09/17/2016, 12:14 PM

## 2016-09-17 NOTE — Care Management Note (Signed)
Case Management Note  Patient Details  Name: Victor Snyder MRN: [id number] Date of Birth: 10/08/1955  Subjective/Objective:   Right THA                Action/Plan: Discharge Planning: NCM spoke to pt and states no DME needed. PT did not recommend any PT follow up. Wife at home to assist with care.   PCP Nadara EatonLOUGH, JEFFREY DAVID MD  Expected Discharge Date:  09/17/2016               Expected Discharge Plan:  Home/Self Care  In-House Referral:  NA  Discharge planning Services  CM Consult  Post Acute Care Choice:  NA Choice offered to:  NA  DME Arranged:  N/A DME Agency:  NA  HH Arranged:  NA HH Agency:  NA  Status of Service:  Completed, signed off  If discussed at Long Length of Stay Meetings, dates discussed:    Additional Comments:  Elliot CousinShavis, Carlyle Achenbach Ellen, RN 09/17/2016, 11:12 AM

## 2016-09-17 NOTE — Progress Notes (Signed)
Discharge instructions discussed with patient and wife, after he verbalized permission to speak in front of her. Pt states understanding of all. Follow up appointment instructions (pt to call ortho office Monday morning) discussed with patient. Prescriptions to patient and patient instructed not to drive while taking medication. Patient discharged in stable condition, with normal vital signs, in no distress, alert, verbal, oriented x4 and able to ambulate. Patient taken via wheelchair to car with wife to drive him home. All questions answered. Pt states he will call 911 if he has chest pain, shortness of breath, or fever.

## 2016-09-17 NOTE — Op Note (Deleted)
  The note originally documented on this encounter has been moved the the encounter in which it belongs.  

## 2016-09-21 NOTE — Discharge Summary (Signed)
Patient ID: Victor Snyder MRN: [id number] DOB/AGE: 63/09/1955 63 y.o.  Admit date: 09/16/2016 Discharge date: 09/21/2016  Admission Diagnoses:  Principal Problem:   Unilateral primary osteoarthritis, right hip Active Problems:   Status post total replacement of right hip   Discharge Diagnoses:  Same  Past Medical History:  Diagnosis Date  . Arthritis    OSTEO     Surgeries: Procedure(s): RIGHT TOTAL HIP ARTHROPLASTY ANTERIOR APPROACH on 09/16/2016   Consultants:   Discharged Condition: Improved  Hospital Course: Victor Snyder is an 61 y.o. male who was admitted 09/16/2016 for operative treatment ofUnilateral primary osteoarthritis, right hip. Patient has severe unremitting pain that affects sleep, daily activities, and work/hobbies. After pre-op clearance the patient was taken to the operating room on 09/16/2016 and underwent  Procedure(s): RIGHT TOTAL HIP ARTHROPLASTY ANTERIOR APPROACH.    Patient was given perioperative antibiotics:  Anti-infectives    Start     Dose/Rate Route Frequency Ordered Stop   09/16/16 1800  ceFAZolin (ANCEF) IVPB 1 g/50 mL premix     1 g 100 mL/hr over 30 Minutes Intravenous Every 6 hours 09/16/16 1255 09/17/16 0039   09/16/16 0737  ceFAZolin (ANCEF) IVPB 2g/100 mL premix     2 g 200 mL/hr over 30 Minutes Intravenous On call to O.R. 09/16/16 0737 09/16/16 1018       Patient was given sequential compression devices, early ambulation, and chemoprophylaxis to prevent DVT.  Patient benefited maximally from hospital stay and there were no complications.    Recent vital signs: No data found.    Recent laboratory studies: No results for input(s): WBC, HGB, HCT, PLT, NA, K, CL, CO2, BUN, CREATININE, GLUCOSE, INR, CALCIUM in the last 72 hours.  Invalid input(s): PT, 2   Discharge Medications:   Allergies as of 09/17/2016   No Known Allergies     Medication List    TAKE these medications   aspirin 81 MG chewable tablet Chew 1 tablet  (81 mg total) by mouth 2 (two) times daily.   clobetasol cream 0.05 % Commonly known as:  TEMOVATE Apply 1 application topically 2 (two) times daily as needed (for irritated skin/rash).   cyclobenzaprine 5 MG tablet Commonly known as:  FLEXERIL Take 1 tablet (5 mg total) by mouth 3 (three) times daily as needed.   naproxen 500 MG tablet Commonly known as:  NAPROSYN Take 1 po BID with food prn pain   naproxen sodium 220 MG tablet Commonly known as:  ANAPROX Take 1,100 mg by mouth daily.   oxyCODONE-acetaminophen 5-325 MG tablet Commonly known as:  ROXICET Take 1-2 tablets by mouth every 4 (four) hours as needed.   rosuvastatin 10 MG tablet Commonly known as:  CRESTOR Take 10 mg by mouth daily.       Diagnostic Studies: Dg Pelvis Portable  Result Date: 09/16/2016 CLINICAL DATA:  Postop right anterior approach hip replacement. EXAM: PORTABLE PELVIS 1-2 VIEWS COMPARISON:  Earlier same day FINDINGS: Single view shows new total hip replacement on the right. Components appear well positioned. No radiographically detectable complication. No evidence of retained object. IMPRESSION: Good appearance following total hip replacement on the right. Electronically Signed   By: Paulina FusiMark  Shogry M.D.   On: 09/16/2016 12:05   Dg C-arm 1-60 Min-no Report  Result Date: 09/16/2016 Fluoroscopy was utilized by the requesting physician.  No radiographic interpretation.   Dg Hip Operative Unilat With Pelvis Right  Result Date: 09/16/2016 CLINICAL DATA:  Right total hip replacement EXAM: OPERATIVE RIGHT HIP (  WITH PELVIS IF PERFORMED) 2 VIEWS TECHNIQUE: Fluoroscopic spot image(s) were submitted for interpretation post-operatively. COMPARISON:  None FLUOROSCOPY TIME:  0.5 minute FINDINGS: Interval right total hip arthroplasty without failure complication. No dislocation. Prior left hip arthroplasty. IMPRESSION: Interval right total hip arthroplasty. Electronically Signed   By: Elige Ko   On: 09/16/2016  11:34    Disposition: 01-Home or Self Care  Discharge Instructions    Call MD / Call 911    Complete by:  As directed    If you experience chest pain or shortness of breath, CALL 911 and be transported to the hospital emergency room.  If you develope a fever above 101 F, pus (white drainage) or increased drainage or redness at the wound, or calf pain, call your surgeon's office.   Constipation Prevention    Complete by:  As directed    Drink plenty of fluids.  Prune juice may be helpful.  You may use a stool softener, such as Colace (over the counter) 100 mg twice a day.  Use MiraLax (over the counter) for constipation as needed.   Diet - low sodium heart healthy    Complete by:  As directed    Increase activity slowly as tolerated    Complete by:  As directed       Follow-up Information    Kathryne Hitch, MD Follow up in 2 week(s).   Specialty:  Orthopedic Surgery Contact information: [address] [place] [address] [phone]            Signed: Kathryne Hitch 09/21/2016, 7:38 AM

## 2016-09-22 ENCOUNTER — Encounter (INDEPENDENT_AMBULATORY_CARE_PROVIDER_SITE_OTHER): Payer: Self-pay | Admitting: Physician Assistant

## 2016-09-23 NOTE — Progress Notes (Signed)
   09/16/16 1850  PT G-Codes **NOT FOR INPATIENT CLASS**  Functional Assessment Tool Used Clinical judgement  Functional Limitation Mobility: Walking and moving around  Mobility: Walking and Moving Around Current Status [phone](G8978) CI  Mobility: Walking and Moving Around Goal Status [phone](G8979) [facility]  Marella BileSharron Kayron Hicklin, PT Pager: [phone] 09/23/2016

## 2016-09-29 ENCOUNTER — Ambulatory Visit (INDEPENDENT_AMBULATORY_CARE_PROVIDER_SITE_OTHER): Payer: PRIVATE HEALTH INSURANCE | Admitting: Physician Assistant

## 2016-09-29 ENCOUNTER — Encounter (INDEPENDENT_AMBULATORY_CARE_PROVIDER_SITE_OTHER): Payer: Self-pay | Admitting: Physician Assistant

## 2016-09-29 DIAGNOSIS — Z96641 Presence of right artificial hip joint: Secondary | ICD-10-CM

## 2016-09-29 NOTE — Progress Notes (Signed)
Office Visit Note   Patient: Victor Snyder           Date of Birth: 28-Feb-1956           MRN: [id number] Visit Date: 09/29/2016              Requested by: Orson Aloe, MD [address] [place], [place] [address] PCP: Marda Stalker DAVID, MD   Assessment & Plan: Visit Diagnoses: No diagnosis found.  Plan: To need work on strengthening range of motion of the right hip. If his seroma becomes uncomfortable or if there is any signs of infection he should return sooner otherwise we'll see him back in 1 month for follow-up. We did have an turn to work full duties on 10/17/2016 at his request. He will apply heat to the seroma. I did offer to aspirate this he defers.  Follow-Up Instructions: Return in about 4 weeks (around 10/27/2016) for post op.   Orders:  No orders of the defined types were placed in this encounter.  No orders of the defined types were placed in this encounter.     Procedures: No procedures performed   Clinical Data: No additional findings.   Subjective: Chief Complaint  Patient presents with  . Right Hip - Routine Post Op    Victor Snyder is here for his 2 week postop appointment from right total hip arthrolpasty.  He states that he is doing good. He says that it is a little swollen at the joint still, and the muscles seem stiff. Mainly just using his muscle relaxer not taking any pain medicines. He is asking to go back to work on 10/17/2016 full duties. He remains on aspirin for DVT prophylaxis    Review of Systems Denies fevers ,chills, shortness, breath or calf pain   Objective: Vital Signs: There were no vitals taken for this visit.  Physical Exam  Constitutional: He is oriented to person, place, and time. He appears well-developed and well-nourished.  Pulmonary/Chest: Effort normal.  Neurological: He is alert and oriented to person, place, and time.    Ortho Exam Right hip surgical incisions healing well no signs of infection he does have  a seroma small. Calf supple nontender. He has overall good range of motion of the right hip without pain. He is ambulating without any assistive device. Specialty Comments:  No specialty comments available.  Imaging: No results found.   PMFS History: Patient Active Problem List   Diagnosis Date Noted  . Unilateral primary osteoarthritis, right hip 09/16/2016  . Status post total replacement of right hip 09/16/2016  . Degenerative arthritis of hip 02/12/2013   Past Medical History:  Diagnosis Date  . Arthritis    OSTEO     No family history on file.  Past Surgical History:  Procedure Laterality Date  . colonscopy  10 yrs ago  . KNEE ARTHROSCOPY Right 1998 or 1999  . LUMBAR LAMINECTOMY/DECOMPRESSION MICRODISCECTOMY  01/27/2012   Procedure: LUMBAR LAMINECTOMY/DECOMPRESSION MICRODISCECTOMY;  Surgeon: Barnett Abu, MD;  Location: [facility];  Service: Neurosurgery;  Laterality: Left;  Left Lumbar five sacral one  Microdiskectomy  . TOTAL HIP ARTHROPLASTY Left 0715/2014   Dr Magnus Ivan  . TOTAL HIP ARTHROPLASTY Left 02/12/2013   Procedure: LEFT TOTAL HIP ARTHROPLASTY ANTERIOR APPROACH;  Surgeon: Kathryne Hitch, MD;  Location: [facility];  Service: Orthopedics;  Laterality: Left;  . TOTAL HIP ARTHROPLASTY Right 09/16/2016   Procedure: RIGHT TOTAL HIP ARTHROPLASTY ANTERIOR APPROACH;  Surgeon: Kathryne Hitch, MD;  Location: [facility];  Service: Orthopedics;  Laterality: Right;   Social History   Occupational History  . Not on file.   Social History Main Topics  . Smoking status: Light Tobacco Smoker    Packs/day: 1.00    Years: 30.00    Types: Cigars  . Smokeless tobacco: Never Used     Comment: social drinker  . Alcohol use Yes     Comment: 1-2 beers every couple of days  . Drug use: No  . Sexual activity: Not on file     Comment: Cigars once every few months

## 2016-10-13 ENCOUNTER — Ambulatory Visit (INDEPENDENT_AMBULATORY_CARE_PROVIDER_SITE_OTHER): Payer: PRIVATE HEALTH INSURANCE | Admitting: Physician Assistant

## 2016-10-13 ENCOUNTER — Encounter (INDEPENDENT_AMBULATORY_CARE_PROVIDER_SITE_OTHER): Payer: Self-pay | Admitting: Physician Assistant

## 2016-10-13 DIAGNOSIS — Z96641 Presence of right artificial hip joint: Secondary | ICD-10-CM

## 2016-10-13 NOTE — Progress Notes (Signed)
   Post-Op Visit Note   Patient: Victor SellsRobert L Lindler           Date of Birth: 08/04/1955           MRN: [id number] Visit Date: 10/13/2016 PCP: Marda StalkerLOUGH, JEFFREY DAVID, MD   Assessment & Plan:  Chief Complaint:  Chief Complaint  Patient presents with  . Right Hip - Follow-up   Visit Diagnoses:  1. Status post total replacement of right hip    History of present illness: Doing well he is wondering no of the seromas became large enough that we would consider taking off. He states is really not bothering him. He does not feel as gotten any larger. He's having no fevers chills.  Physical exam: right hip he has overall good range of motion hip surgical incisions healing well. He does have a seroma no signs of infection. Plan: We will see him back in 1 month check his progress lack of. Removed 90 mL of this serosanguineous fluid patient tolerated well.  Follow-Up Instructions: Return in about 4 weeks (around 11/10/2016).   Orders:  No orders of the defined types were placed in this encounter.  No orders of the defined types were placed in this encounter.   Imaging: No results found.  PMFS History: Patient Active Problem List   Diagnosis Date Noted  . Unilateral primary osteoarthritis, right hip 09/16/2016  . Status post total replacement of right hip 09/16/2016  . Degenerative arthritis of hip 02/12/2013   Past Medical History:  Diagnosis Date  . Arthritis    OSTEO     No family history on file.  Past Surgical History:  Procedure Laterality Date  . colonscopy  10 yrs ago  . KNEE ARTHROSCOPY Right 1998 or 1999  . LUMBAR LAMINECTOMY/DECOMPRESSION MICRODISCECTOMY  01/27/2012   Procedure: LUMBAR LAMINECTOMY/DECOMPRESSION MICRODISCECTOMY;  Surgeon: Barnett AbuHenry Elsner, MD;  Location: [facility];  Service: Neurosurgery;  Laterality: Left;  Left Lumbar five sacral one  Microdiskectomy  . TOTAL HIP ARTHROPLASTY Left 0715/2014   Dr Magnus IvanBlackman  . TOTAL HIP ARTHROPLASTY Left 02/12/2013   Procedure: LEFT TOTAL HIP ARTHROPLASTY ANTERIOR APPROACH;  Surgeon: Kathryne Hitchhristopher Y Blackman, MD;  Location: [facility];  Service: Orthopedics;  Laterality: Left;  . TOTAL HIP ARTHROPLASTY Right 09/16/2016   Procedure: RIGHT TOTAL HIP ARTHROPLASTY ANTERIOR APPROACH;  Surgeon: Kathryne Hitchhristopher Y Blackman, MD;  Location: [facility];  Service: Orthopedics;  Laterality: Right;   Social History   Occupational History  . Not on file.   Social History Main Topics  . Smoking status: Light Tobacco Smoker    Packs/day: 1.00    Years: 30.00    Types: Cigars  . Smokeless tobacco: Never Used     Comment: social drinker  . Alcohol use Yes     Comment: 1-2 beers every couple of days  . Drug use: No  . Sexual activity: Not on file     Comment: Cigars once every few months

## 2016-11-02 ENCOUNTER — Ambulatory Visit (INDEPENDENT_AMBULATORY_CARE_PROVIDER_SITE_OTHER): Payer: PRIVATE HEALTH INSURANCE | Admitting: Physician Assistant

## 2016-11-14 ENCOUNTER — Encounter (INDEPENDENT_AMBULATORY_CARE_PROVIDER_SITE_OTHER): Payer: Self-pay | Admitting: Physician Assistant

## 2016-11-14 ENCOUNTER — Ambulatory Visit (INDEPENDENT_AMBULATORY_CARE_PROVIDER_SITE_OTHER): Payer: PRIVATE HEALTH INSURANCE | Admitting: Physician Assistant

## 2016-11-14 VITALS — Ht 73.0 in | Wt 236.0 lb

## 2016-11-14 DIAGNOSIS — Z96641 Presence of right artificial hip joint: Secondary | ICD-10-CM

## 2016-11-14 NOTE — Progress Notes (Signed)
      History of present illness: Victor Snyder 61 year old male returns today status post right total hip arthroplasty nearly 2 months postop. Overall doing well. Some stiffness when he first gets up from sitting otherwise no complaints.  Physical exam: Right hip excellent range of motion without pain. Surgical incision is healed well no signs of infection. No recurrence of the seroma. He ambulates without any assistive devices or an antalgic gait.   Plan: He'll follow with [place] at 1 year postop at that time we'll obtain an AP pelvis and a lateral view of the right hip. He has any questions or concerns he'll return sooner.

## 2017-02-10 NOTE — Anesthesia Postprocedure Evaluation (Signed)
Anesthesia Post Note  Patient: Victor SellsRobert L Ruesch  Procedure(s) Performed: Procedure(s) (LRB): RIGHT TOTAL HIP ARTHROPLASTY ANTERIOR APPROACH (Right)     Anesthesia Post Evaluation  Last Vitals:  Vitals:   09/17/16 0626 09/17/16 1022  BP: 123/69 137/78  Pulse: (!) 57 69  Resp: 16 20  Temp: 37.1 C 37.2 C    Last Pain:  Vitals:   09/17/16 1200  TempSrc:   PainSc: 0-No pain                 Tamyah Cutbirth EDWARD

## 2017-02-10 NOTE — Addendum Note (Signed)
Addendum  created 02/10/17 1708 by Cristela BlueJackson, Makaylen Thieme, MD   Sign clinical note

## 2017-09-18 ENCOUNTER — Ambulatory Visit (INDEPENDENT_AMBULATORY_CARE_PROVIDER_SITE_OTHER): Payer: PRIVATE HEALTH INSURANCE | Admitting: Physician Assistant

## 2018-08-19 IMAGING — RF DG HIP (WITH PELVIS) OPERATIVE*R*
1 series · 2 of 2 positions shown · non-contrast
Comparison: None

FLUOROSCOPY TIME:  0.5 minute

CLINICAL DATA: Right total hip replacement

EXAM:
OPERATIVE RIGHT HIP (WITH PELVIS IF PERFORMED) 2 VIEWS
TECHNIQUE: Fluoroscopic spot image(s) were submitted for interpretation
post-operatively.

[Series 1: run · 2 of 2 slices shown]
[im 1/2]
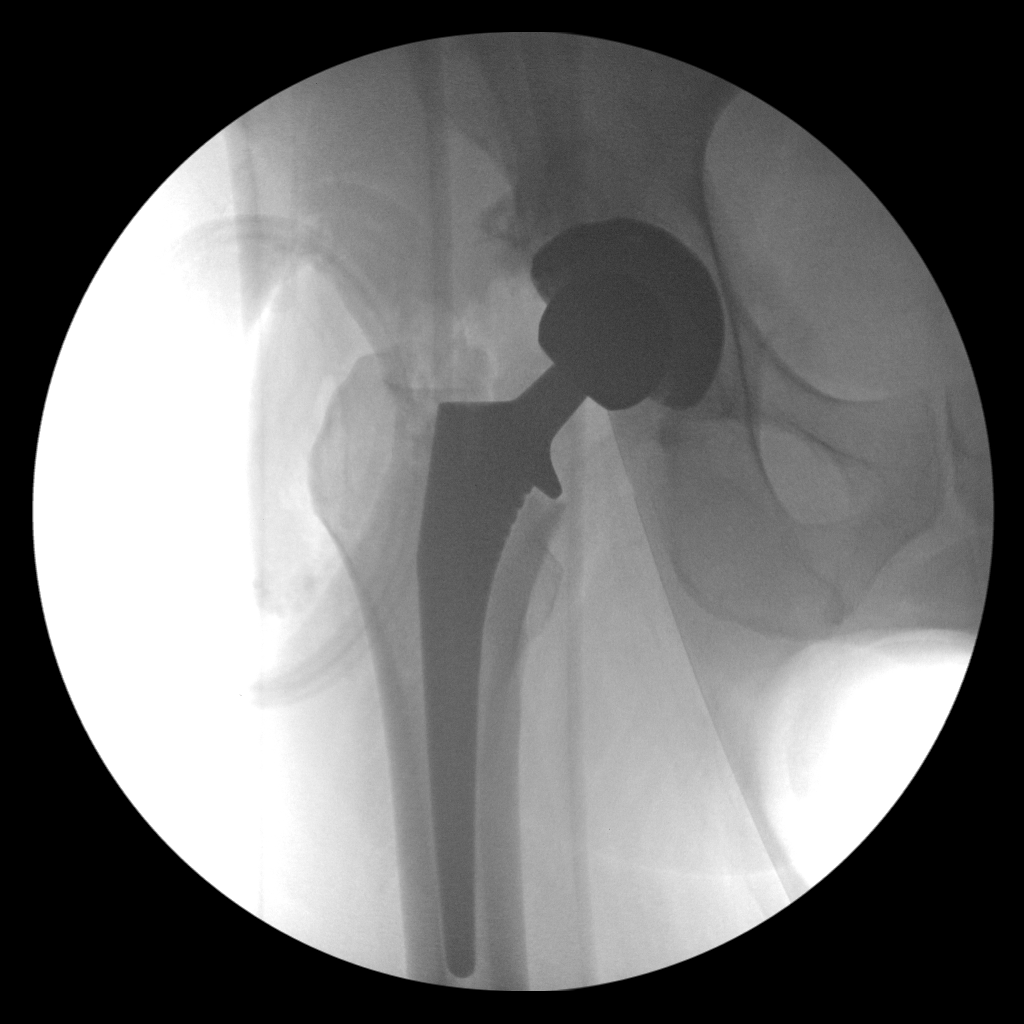
[im 2/2]
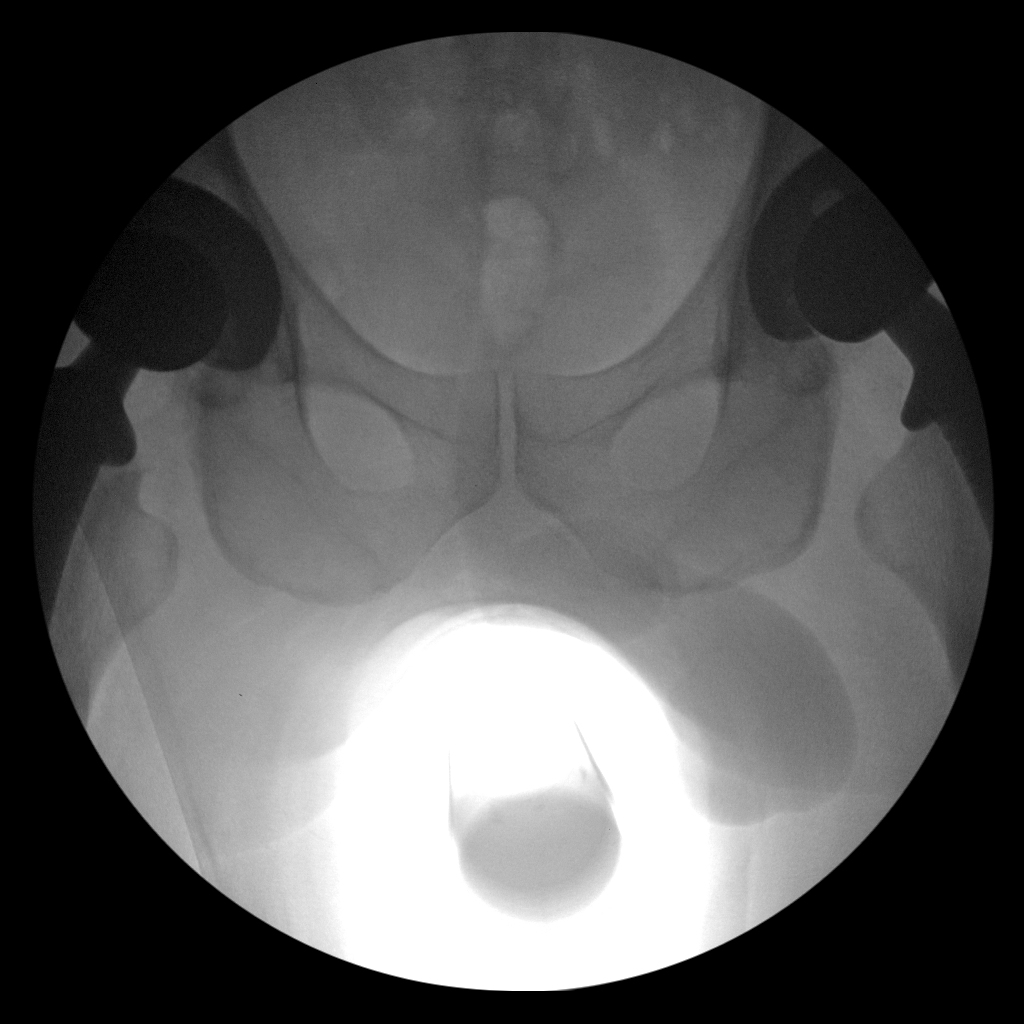

[2 of 2 positions shown; findings below may reference images not displayed]

FINDINGS: Interval right total hip arthroplasty without failure complication.
No dislocation.

Prior left hip arthroplasty.
IMPRESSION: Interval right total hip arthroplasty.
